# Patient Record
Sex: Female | Born: 1935 | Race: White | Hispanic: No | Smoking: Never smoker
Health system: Southern US, Community
[De-identification: ages and names within clinical notes are randomized; demographics above are authoritative.]

## PROBLEM LIST (undated history)

## (undated) DIAGNOSIS — I259 Chronic ischemic heart disease, unspecified: Secondary | ICD-10-CM

## (undated) DIAGNOSIS — I10 Essential (primary) hypertension: Secondary | ICD-10-CM

## (undated) DIAGNOSIS — I5032 Chronic diastolic (congestive) heart failure: Secondary | ICD-10-CM

## (undated) DIAGNOSIS — F32A Depression, unspecified: Secondary | ICD-10-CM

## (undated) DIAGNOSIS — I7100 Dissection of unspecified site of aorta: Secondary | ICD-10-CM

## (undated) DIAGNOSIS — E785 Hyperlipidemia, unspecified: Secondary | ICD-10-CM

## (undated) DIAGNOSIS — I4892 Unspecified atrial flutter: Secondary | ICD-10-CM

## (undated) DIAGNOSIS — K219 Gastro-esophageal reflux disease without esophagitis: Secondary | ICD-10-CM

## (undated) DIAGNOSIS — Z8673 Personal history of transient ischemic attack (TIA), and cerebral infarction without residual deficits: Secondary | ICD-10-CM

## (undated) DIAGNOSIS — F329 Major depressive disorder, single episode, unspecified: Secondary | ICD-10-CM

## (undated) DIAGNOSIS — I2510 Atherosclerotic heart disease of native coronary artery without angina pectoris: Secondary | ICD-10-CM

## (undated) DIAGNOSIS — I219 Acute myocardial infarction, unspecified: Secondary | ICD-10-CM

## (undated) DIAGNOSIS — I639 Cerebral infarction, unspecified: Secondary | ICD-10-CM

## (undated) DIAGNOSIS — Z950 Presence of cardiac pacemaker: Secondary | ICD-10-CM

## (undated) HISTORY — PX: JOINT REPLACEMENT: SHX530

## (undated) HISTORY — PX: APPENDECTOMY: SHX54

## (undated) HISTORY — DX: Chronic ischemic heart disease, unspecified: I25.9

## (undated) HISTORY — PX: OTHER SURGICAL HISTORY: SHX169

## (undated) HISTORY — DX: Essential (primary) hypertension: I10

## (undated) HISTORY — DX: Unspecified atrial flutter: I48.92

## (undated) HISTORY — DX: Gastro-esophageal reflux disease without esophagitis: K21.9

## (undated) HISTORY — DX: Dissection of unspecified site of aorta: I71.00

## (undated) HISTORY — DX: Depression, unspecified: F32.A

## (undated) HISTORY — DX: Atherosclerotic heart disease of native coronary artery without angina pectoris: I25.10

## (undated) HISTORY — DX: Hyperlipidemia, unspecified: E78.5

## (undated) HISTORY — DX: Personal history of transient ischemic attack (TIA), and cerebral infarction without residual deficits: Z86.73

## (undated) HISTORY — PX: ABDOMINAL HYSTERECTOMY: SHX81

## (undated) HISTORY — DX: Major depressive disorder, single episode, unspecified: F32.9

## (undated) HISTORY — DX: Acute myocardial infarction, unspecified: I21.9

## (undated) HISTORY — DX: Cerebral infarction, unspecified: I63.9

---

## 1999-09-16 ENCOUNTER — Encounter: Payer: Self-pay | Admitting: Emergency Medicine

## 1999-09-16 ENCOUNTER — Observation Stay (HOSPITAL_COMMUNITY): Admission: EM | Admit: 1999-09-16 | Discharge: 1999-09-17 | Payer: Self-pay | Admitting: Emergency Medicine

## 1999-09-16 ENCOUNTER — Encounter: Payer: Self-pay | Admitting: Neurology

## 1999-09-17 ENCOUNTER — Encounter: Payer: Self-pay | Admitting: Neurology

## 2006-03-23 ENCOUNTER — Ambulatory Visit: Payer: Self-pay | Admitting: Cardiovascular Disease

## 2006-03-23 ENCOUNTER — Encounter: Payer: Self-pay | Admitting: Cardiology

## 2006-03-23 ENCOUNTER — Inpatient Hospital Stay (HOSPITAL_COMMUNITY): Admission: EM | Admit: 2006-03-23 | Discharge: 2006-03-27 | Payer: Self-pay | Admitting: Emergency Medicine

## 2006-03-31 ENCOUNTER — Ambulatory Visit: Payer: Self-pay | Admitting: Internal Medicine

## 2006-04-08 ENCOUNTER — Ambulatory Visit: Payer: Self-pay | Admitting: Cardiovascular Disease

## 2006-04-09 ENCOUNTER — Ambulatory Visit: Payer: Self-pay

## 2006-04-14 ENCOUNTER — Ambulatory Visit: Payer: Self-pay | Admitting: Internal Medicine

## 2006-04-29 ENCOUNTER — Observation Stay (HOSPITAL_COMMUNITY): Admission: RE | Admit: 2006-04-29 | Discharge: 2006-05-01 | Payer: Self-pay | Admitting: Internal Medicine

## 2006-04-29 ENCOUNTER — Ambulatory Visit: Payer: Self-pay | Admitting: Internal Medicine

## 2006-05-04 ENCOUNTER — Ambulatory Visit: Payer: Self-pay | Admitting: Cardiology

## 2006-05-14 ENCOUNTER — Ambulatory Visit: Payer: Self-pay | Admitting: Cardiology

## 2006-05-19 ENCOUNTER — Ambulatory Visit: Payer: Self-pay | Admitting: Internal Medicine

## 2006-09-06 ENCOUNTER — Ambulatory Visit: Payer: Self-pay | Admitting: Internal Medicine

## 2006-09-06 ENCOUNTER — Ambulatory Visit: Payer: Self-pay | Admitting: Cardiology

## 2006-09-17 ENCOUNTER — Ambulatory Visit: Payer: Self-pay | Admitting: Cardiology

## 2006-12-23 ENCOUNTER — Ambulatory Visit: Payer: Self-pay | Admitting: Internal Medicine

## 2006-12-25 ENCOUNTER — Inpatient Hospital Stay (HOSPITAL_COMMUNITY): Admission: EM | Admit: 2006-12-25 | Discharge: 2006-12-26 | Payer: Self-pay | Admitting: Emergency Medicine

## 2006-12-31 ENCOUNTER — Ambulatory Visit: Payer: Self-pay | Admitting: Internal Medicine

## 2006-12-31 ENCOUNTER — Inpatient Hospital Stay (HOSPITAL_COMMUNITY): Admission: EM | Admit: 2006-12-31 | Discharge: 2007-01-02 | Payer: Self-pay | Admitting: Emergency Medicine

## 2006-12-31 LAB — CONVERTED CEMR LAB
Basophils Absolute: 0 10*3/uL (ref 0.0–0.1)
HCT: 24.5 % — ABNORMAL LOW (ref 36.0–46.0)
Hemoglobin: 8.6 g/dL — ABNORMAL LOW (ref 12.0–15.0)
Lymphocytes Relative: 8.1 % — ABNORMAL LOW (ref 12.0–46.0)
Monocytes Absolute: 1 10*3/uL — ABNORMAL HIGH (ref 0.2–0.7)
Monocytes Relative: 8.4 % (ref 3.0–11.0)
Neutrophils Relative %: 83.4 % — ABNORMAL HIGH (ref 43.0–77.0)

## 2007-05-03 ENCOUNTER — Ambulatory Visit: Payer: Self-pay | Admitting: *Deleted

## 2007-05-03 ENCOUNTER — Inpatient Hospital Stay (HOSPITAL_COMMUNITY): Admission: EM | Admit: 2007-05-03 | Discharge: 2007-05-06 | Payer: Self-pay | Admitting: Cardiology

## 2007-05-12 ENCOUNTER — Inpatient Hospital Stay (HOSPITAL_COMMUNITY): Admission: EM | Admit: 2007-05-12 | Discharge: 2007-05-18 | Payer: Self-pay | Admitting: Emergency Medicine

## 2007-05-12 ENCOUNTER — Encounter (INDEPENDENT_AMBULATORY_CARE_PROVIDER_SITE_OTHER): Payer: Self-pay | Admitting: Neurology

## 2007-05-20 ENCOUNTER — Ambulatory Visit: Payer: Self-pay | Admitting: Internal Medicine

## 2007-05-25 ENCOUNTER — Ambulatory Visit: Payer: Self-pay | Admitting: Internal Medicine

## 2007-05-31 ENCOUNTER — Ambulatory Visit: Payer: Self-pay | Admitting: Cardiology

## 2007-05-31 ENCOUNTER — Ambulatory Visit: Payer: Self-pay | Admitting: Cardiovascular Disease

## 2007-06-07 ENCOUNTER — Ambulatory Visit: Payer: Self-pay | Admitting: Cardiology

## 2007-06-21 ENCOUNTER — Ambulatory Visit: Payer: Self-pay | Admitting: Cardiology

## 2007-07-12 ENCOUNTER — Ambulatory Visit: Payer: Self-pay | Admitting: Cardiovascular Disease

## 2007-07-26 ENCOUNTER — Ambulatory Visit: Payer: Self-pay | Admitting: Cardiology

## 2007-08-09 ENCOUNTER — Ambulatory Visit: Payer: Self-pay | Admitting: Cardiology

## 2007-09-05 ENCOUNTER — Ambulatory Visit: Payer: Self-pay | Admitting: Internal Medicine

## 2007-09-09 ENCOUNTER — Inpatient Hospital Stay (HOSPITAL_COMMUNITY): Admission: EM | Admit: 2007-09-09 | Discharge: 2007-09-15 | Payer: Self-pay | Admitting: Emergency Medicine

## 2007-09-09 ENCOUNTER — Ambulatory Visit: Payer: Self-pay | Admitting: Cardiology

## 2007-09-12 ENCOUNTER — Ambulatory Visit: Payer: Self-pay | Admitting: Vascular Surgery

## 2007-09-12 ENCOUNTER — Encounter: Payer: Self-pay | Admitting: Vascular Surgery

## 2007-09-19 ENCOUNTER — Ambulatory Visit: Payer: Self-pay | Admitting: Internal Medicine

## 2007-09-23 ENCOUNTER — Ambulatory Visit: Payer: Self-pay

## 2007-09-23 LAB — CONVERTED CEMR LAB: Prothrombin Time: 15.4 s — ABNORMAL HIGH (ref 10.9–13.3)

## 2007-09-27 ENCOUNTER — Ambulatory Visit: Payer: Self-pay

## 2007-10-03 ENCOUNTER — Ambulatory Visit: Payer: Self-pay | Admitting: Cardiology

## 2007-10-13 ENCOUNTER — Ambulatory Visit: Payer: Self-pay | Admitting: Cardiology

## 2007-10-17 ENCOUNTER — Ambulatory Visit: Payer: Self-pay | Admitting: Internal Medicine

## 2007-10-27 ENCOUNTER — Ambulatory Visit: Payer: Self-pay | Admitting: Cardiology

## 2007-10-27 ENCOUNTER — Ambulatory Visit: Payer: Self-pay | Admitting: Internal Medicine

## 2007-10-27 LAB — CONVERTED CEMR LAB
BUN: 44 mg/dL — ABNORMAL HIGH (ref 6–23)
CO2: 29 meq/L (ref 19–32)
Calcium: 9.7 mg/dL (ref 8.4–10.5)
Creatinine, Ser: 1.9 mg/dL — ABNORMAL HIGH (ref 0.4–1.2)
GFR calc Af Amer: 33 mL/min
Potassium: 4.9 meq/L (ref 3.5–5.1)
Sodium: 143 meq/L (ref 135–145)

## 2007-11-14 ENCOUNTER — Ambulatory Visit: Payer: Self-pay | Admitting: Cardiology

## 2007-11-30 ENCOUNTER — Ambulatory Visit (HOSPITAL_COMMUNITY): Admission: RE | Admit: 2007-11-30 | Discharge: 2007-11-30 | Payer: Self-pay | Admitting: Internal Medicine

## 2007-12-16 ENCOUNTER — Ambulatory Visit: Payer: Self-pay | Admitting: Internal Medicine

## 2007-12-16 ENCOUNTER — Ambulatory Visit: Payer: Self-pay | Admitting: Cardiology

## 2008-01-13 ENCOUNTER — Ambulatory Visit: Payer: Self-pay | Admitting: Internal Medicine

## 2008-03-29 ENCOUNTER — Ambulatory Visit: Payer: Self-pay | Admitting: Internal Medicine

## 2008-04-18 ENCOUNTER — Encounter: Admission: RE | Admit: 2008-04-18 | Discharge: 2008-04-18 | Payer: Self-pay | Admitting: Vascular Surgery

## 2008-04-18 ENCOUNTER — Ambulatory Visit: Payer: Self-pay | Admitting: Vascular Surgery

## 2008-09-26 ENCOUNTER — Ambulatory Visit: Payer: Self-pay | Admitting: Internal Medicine

## 2008-10-17 ENCOUNTER — Encounter: Admission: RE | Admit: 2008-10-17 | Discharge: 2008-10-17 | Payer: Self-pay | Admitting: Vascular Surgery

## 2008-10-17 ENCOUNTER — Ambulatory Visit: Payer: Self-pay | Admitting: Vascular Surgery

## 2008-12-06 ENCOUNTER — Ambulatory Visit: Payer: Self-pay | Admitting: Internal Medicine

## 2008-12-06 ENCOUNTER — Encounter: Payer: Self-pay | Admitting: Internal Medicine

## 2009-01-18 ENCOUNTER — Telehealth: Payer: Self-pay | Admitting: Internal Medicine

## 2009-06-04 ENCOUNTER — Encounter: Payer: Self-pay | Admitting: Internal Medicine

## 2009-07-10 ENCOUNTER — Telehealth (INDEPENDENT_AMBULATORY_CARE_PROVIDER_SITE_OTHER): Payer: Self-pay | Admitting: *Deleted

## 2009-07-17 ENCOUNTER — Ambulatory Visit: Payer: Self-pay | Admitting: Vascular Surgery

## 2009-07-25 ENCOUNTER — Ambulatory Visit: Payer: Self-pay | Admitting: Internal Medicine

## 2009-07-29 DIAGNOSIS — I10 Essential (primary) hypertension: Secondary | ICD-10-CM

## 2009-07-29 DIAGNOSIS — I713 Abdominal aortic aneurysm, ruptured, unspecified: Secondary | ICD-10-CM | POA: Insufficient documentation

## 2009-07-29 DIAGNOSIS — I4891 Unspecified atrial fibrillation: Secondary | ICD-10-CM

## 2009-08-07 ENCOUNTER — Ambulatory Visit (HOSPITAL_COMMUNITY): Admission: RE | Admit: 2009-08-07 | Discharge: 2009-08-07 | Payer: Self-pay | Admitting: Gastroenterology

## 2009-09-27 ENCOUNTER — Encounter: Payer: Self-pay | Admitting: Internal Medicine

## 2010-01-01 ENCOUNTER — Encounter: Payer: Self-pay | Admitting: Internal Medicine

## 2010-01-27 ENCOUNTER — Ambulatory Visit: Payer: Self-pay | Admitting: Internal Medicine

## 2010-01-27 DIAGNOSIS — R634 Abnormal weight loss: Secondary | ICD-10-CM

## 2010-01-28 ENCOUNTER — Encounter: Payer: Self-pay | Admitting: Internal Medicine

## 2010-07-10 ENCOUNTER — Encounter: Admission: RE | Admit: 2010-07-10 | Discharge: 2010-07-10 | Payer: Self-pay | Admitting: Vascular Surgery

## 2010-07-10 ENCOUNTER — Ambulatory Visit: Payer: Self-pay | Admitting: Vascular Surgery

## 2010-07-10 ENCOUNTER — Encounter: Payer: Self-pay | Admitting: Internal Medicine

## 2010-08-04 ENCOUNTER — Ambulatory Visit: Payer: Self-pay | Admitting: Internal Medicine

## 2010-10-12 ENCOUNTER — Encounter: Payer: Self-pay | Admitting: Gastroenterology

## 2010-10-12 ENCOUNTER — Encounter: Payer: Self-pay | Admitting: Internal Medicine

## 2010-10-19 ENCOUNTER — Encounter: Payer: Self-pay | Admitting: Internal Medicine

## 2010-10-20 ENCOUNTER — Encounter: Payer: Self-pay | Admitting: Internal Medicine

## 2010-10-21 ENCOUNTER — Encounter: Payer: Self-pay | Admitting: Internal Medicine

## 2010-10-21 NOTE — Letter (Signed)
Summary: ECHO 4.14.11  ECHO 4.14.11   Imported By: Faythe Ghee 01/28/2010 09:50:03  _____________________________________________________________________  External Attachment:    Type:   Image     Comment:   External Document

## 2010-10-21 NOTE — Assessment & Plan Note (Signed)
Summary: PER CHECK OUT/SF   Primary Provider:  Damian Leavell  CC:  irregular heart beat/swelling in both ankles.  History of Present Illness: Ms. [name] returns today for followup.  She is a pleasant 77 yo woman with a h/o atrial fibrillation, s/p stroke complicated by vision loss.  She has a chronic aortic dissection with a residual aneurysm.  She is now in persisent atrial fibrillation but has adequate rate control.  She has diastolic CHF.  No other complaints today except that she continues lose weight as she has had a reduction in her oral intake. No c/p or sob.  Problems Prior to Update: 1)  Essential Hypertension, Benign  (ICD-401.1) 2)  Abdominal Aneurysm, Ruptured  (ICD-441.3) 3)  Atrial Fibrillation  (ICD-427.31)  Current Medications (verified): 1)  Aspirin 81 Mg Tabs (Aspirin) .... One By Mouth Daily 2)  Nexium 40 Mg Cpdr (Esomeprazole Magnesium) .... One By Mouth Daily 3)  Lisinopril 10 Mg Tabs (Lisinopril) .... One By Mouth Daily 4)  Metoprolol Tartrate 25 Mg Tabs (Metoprolol Tartrate) .... One By Mouth Daily 5)  Otc Allergy Med 10 Mg .... One By Mouth Daily As Needed 6)  Xalatan 0.005 % Soln (Latanoprost) .... At Bedtime 7)  Simvastatin 10 Mg Tabs (Simvastatin) .... One By Mouth At Bedtime 8)  Fluoxetine Hcl 40 Mg Caps (Fluoxetine Hcl) .... One By Mouth Daily 9)  Furosemide 20 Mg Tabs (Furosemide) .... One By Mouth Daily 10)  Digoxin 0.125 Mg Tabs (Digoxin) .... 1/2 By Mouth Daily 11)  Meclizine Hcl 25 Mg Tabs (Meclizine Hcl) .... Take 1 Tablet By Mouth Twice A Day Prn 12)  Promethazine Hcl 25 Mg Tabs (Promethazine Hcl) .... As Needed 13)  Nitrostat 0.4 Mg Subl (Nitroglycerin) .Marland Kitchen.. 1 Tablet Under Tongue At Onset of Chest Pain; You May Repeat Every 5 Minutes For Up To 3 Doses. 14)  Aricept 5 Mg Tabs (Donepezil Hcl) .... Once Daily 15)  Sertraline Hcl 50 Mg Tabs (Sertraline Hcl) .... Once Daily  Allergies: 1)  ! Amoxicillin 2)  ! * Latex  Past History:  Past  Medical History: Last updated: 12/06/2008 Recurrent (asymptomatic) atrial flutter off of amiodarone.  Coronary artery disease.  History of stroke Contained aortic dissection Hypertension Ischemic heart disease status post myocardial infarction GERD  depression  Review of Systems       The patient complains of weight loss.  The patient denies chest pain, syncope, and peripheral edema.    Vital Signs:  Patient profile:   76 year old female Height:      65 inches Weight:      134 pounds BMI:     22.38 Pulse rate:   50 / minute BP sitting:   141 / 79  (left arm) Cuff size:   regular  Vitals Entered By: Oswald Hillock (Jan 27, 2010 12:02 PM)  Physical Exam  General:  77 yo woman NAD. Head:  normocephalic and atraumatic Eyes:  PERRLA/EOM intact; conjunctiva and lids normal. Neck:  Neck supple, no JVD. No masses, thyromegaly or abnormal cervical nodes. Lungs:  Clear bilaterally with no wheezes, rales, or rhonchi. Heart:  RRR with normal S1 and S2.  PMI is not enlarged. Abdomen:  Bowel sounds positive; abdomen soft and minimally tender without masses, organomegaly, or hernias noted. No hepatosplenomegaly. Msk:  Back normal, normal gait. Muscle strength and tone normal. Pulses:  1+ bilaterally. Extremities:  No clubbing or cyanosis. Neurologic:  Alert and oriented x 3. some generalized weakness.   Impression &  Recommendations:  Problem # 1:  ATRIAL FIBRILLATION (ICD-427.31) Her exam today demonstrates a return to NSR.  She will continue her current meds. The following medications were removed from the medication list:    Warfarin Sodium 5 Mg Tabs (Warfarin sodium) ..... Use as directed by anticoagulation clinic Her updated medication list for this problem includes:    Aspirin 81 Mg Tabs (Aspirin) ..... One by mouth daily    Metoprolol Tartrate 25 Mg Tabs (Metoprolol tartrate) ..... One by mouth daily    Digoxin 0.125 Mg Tabs (Digoxin) .Marland Kitchen... 1/2 by mouth daily  Problem  # 2:  ESSENTIAL HYPERTENSION, BENIGN (ICD-401.1) I have asked her to maintain a low sodium diet.  She will continue her meds as below. Her updated medication list for this problem includes:    Aspirin 81 Mg Tabs (Aspirin) ..... One by mouth daily    Lisinopril 10 Mg Tabs (Lisinopril) ..... One by mouth daily    Metoprolol Tartrate 25 Mg Tabs (Metoprolol tartrate) ..... One by mouth daily    Furosemide 20 Mg Tabs (Furosemide) ..... One by mouth daily  Problem # 3:  WEIGHT LOSS (ICD-783.21) I have asked her to increase her by mouth intake.   Patient Instructions: 1)  Your physician wants you to follow-up in: 6 months with Dr Ladona Ridgel.  You will receive a reminder letter in the mail two months in advance. If you don't receive a letter, please call our office to schedule the follow-up appointment.  Appended Document: PER CHECK OUT/SF Note discontinuation of her coumadin secondary to falls. I agree.

## 2010-10-21 NOTE — Letter (Signed)
Summary: MED LIST 1.5.2011  MED LIST 1.5.2011   Imported By: Faythe Ghee 01/28/2010 09:49:39  _____________________________________________________________________  External Attachment:    Type:   Image     Comment:   External Document

## 2010-10-21 NOTE — Assessment & Plan Note (Signed)
Summary: 6 month rov/sl   Visit Type:  Follow-up Primary Provider:  Dr.Robert Reinaldo Raddle   History of Present Illness: Ms. [name] returns today for followup.  She is a pleasant 77 yo woman with a h/o atrial fibrillation, s/p stroke complicated by vision loss.  She has a chronic aortic dissection with a residual aneurysm.  She is now in persisent atrial fibrillation but has adequate rate control.  She has diastolic CHF.  Since I last saw her she has gained weight.  No c/p or sob. She has been hospitalized for atrial fib with an RVR.  Previously she developed thyroid problems with amiodarone.  Current Medications (verified): 1)  Aspirin 81 Mg Tabs (Aspirin) .... One By Mouth Daily 2)  Nexium 40 Mg Cpdr (Esomeprazole Magnesium) .... One By Mouth Daily 3)  Lisinopril 10 Mg Tabs (Lisinopril) .... One By Mouth Daily 4)  Metoprolol Tartrate 25 Mg Tabs (Metoprolol Tartrate) .... 1/2 Two Times A Day 5)  Otc Allergy Med 10 Mg .... One By Mouth Daily As Needed 6)  Xalatan 0.005 % Soln (Latanoprost) .... At Bedtime 7)  Simvastatin 10 Mg Tabs (Simvastatin) .... One By Mouth At Bedtime 8)  Fluoxetine Hcl 40 Mg Caps (Fluoxetine Hcl) .... One By Mouth Daily 9)  Furosemide 20 Mg Tabs (Furosemide) .... One By Mouth Daily 10)  Meclizine Hcl 25 Mg Tabs (Meclizine Hcl) .... Take 1 Tablet By Mouth Twice A Day Prn 11)  Promethazine Hcl 25 Mg Tabs (Promethazine Hcl) .... As Needed 12)  Nitrostat 0.4 Mg Subl (Nitroglycerin) .Marland Kitchen.. 1 Tablet Under Tongue At Onset of Chest Pain; You May Repeat Every 5 Minutes For Up To 3 Doses. 13)  Aricept 5 Mg Tabs (Donepezil Hcl) .... Once Daily 14)  Sertraline Hcl 50 Mg Tabs (Sertraline Hcl) .... Once Daily  Allergies: 1)  ! Amoxicillin 2)  ! * Latex  Past History:  Past Medical History: Last updated: 12/06/2008 Recurrent (asymptomatic) atrial flutter off of amiodarone.  Coronary artery disease.  History of stroke Contained aortic dissection Hypertension Ischemic heart  disease status post myocardial infarction GERD  depression  Review of Systems  The patient denies chest pain, syncope, dyspnea on exertion, and peripheral edema.    Vital Signs:  Patient profile:   77 year old female Height:      65 inches Weight:      143 pounds BMI:     23.88 Pulse rate:   54 / minute BP sitting:   92 / 70  (left arm)  Vitals Entered By: Laurance Flatten CMA (August 04, 2010 10:16 AM)  Physical Exam  General:  Well developed, well nourished, in no acute distress. Head:  normocephalic and atraumatic Eyes:  PERRLA/EOM intact; conjunctiva and lids normal. Neck:  Neck supple, no JVD. No masses, thyromegaly or abnormal cervical nodes. Lungs:  Clear bilaterally with no wheezes, rales, or rhonchi. Heart:  RRR with normal S1 and S2.  PMI is not enlarged. Abdomen:  Bowel sounds positive; abdomen soft and minimally tender without masses, organomegaly, or hernias noted. No hepatosplenomegaly. Pulses:  1+ bilaterally. Extremities:  No clubbing or cyanosis. Neurologic:  Alert and oriented x 3. some generalized weakness.   EKG  Procedure date:  08/04/2010  Findings:      Normal sinus rhythm with rate of:  54.  Impression & Recommendations:  Problem # 1:  ATRIAL FIBRILLATION (ICD-427.31) Her symptoms are still not well controlled.  I have discussed the treatment options and have recommended she try Multaq in hopes  of better rhythm control.  Will follow. The following medications were removed from the medication list:    Digoxin 0.125 Mg Tabs (Digoxin) .Marland Kitchen... 1/2 by mouth daily Her updated medication list for this problem includes:    Aspirin 81 Mg Tabs (Aspirin) ..... One by mouth daily    Metoprolol Tartrate 25 Mg Tabs (Metoprolol tartrate) .Marland Kitchen... 1/2 two times a day    Multaq 400 Mg Tabs (Dronedarone hcl) ..... One by mouth bid  Problem # 2:  ESSENTIAL HYPERTENSION, BENIGN (ICD-401.1) Her blood pressure is well controlled.  She may not require her lisinopril but  she is not symptomatic. Her updated medication list for this problem includes:    Aspirin 81 Mg Tabs (Aspirin) ..... One by mouth daily    Lisinopril 10 Mg Tabs (Lisinopril) ..... One by mouth daily    Metoprolol Tartrate 25 Mg Tabs (Metoprolol tartrate) .Marland Kitchen... 1/2 two times a day    Furosemide 20 Mg Tabs (Furosemide) ..... One by mouth daily  Problem # 3:  WEIGHT LOSS (ICD-783.21) This has resolved. Her appetite is improved.  Patient Instructions: 1)  Your physician recommends that you schedule a follow-up appointment in: 3 months with Dr Ladona Ridgel 2)  Your physician has recommended you make the following change in your medication: start Multaq 400mg  bid Prescriptions: MULTAQ 400 MG TABS (DRONEDARONE HCL) one by mouth bid  #60 x 6   Entered by:   Dennis Bast, RN, BSN   Authorized by:   Laren Boom, MD, [facility]   Signed by:   Dennis Bast, RN, BSN on 08/04/2010   Method used:   Print then Give to Patient   RxID:   [id number]

## 2010-10-21 NOTE — Letter (Signed)
Summary: EKG 8.26.2009  EKG 8.26.2009   Imported By: Faythe Ghee 01/28/2010 09:49:05  _____________________________________________________________________  External Attachment:    Type:   Image     Comment:   External Document

## 2010-10-21 NOTE — Letter (Signed)
Summary: [facility]   Imported By: Marylou Mccoy 03/25/2010 13:02:43  _____________________________________________________________________  External Attachment:    Type:   Image     Comment:   External Document

## 2010-10-21 NOTE — Letter (Signed)
Summary: [facility] Visit   [facility] Visit   Imported By: Roderic Ovens 08/07/2010 11:20:27  _____________________________________________________________________  External Attachment:    Type:   Image     Comment:   External Document

## 2010-10-21 NOTE — Procedures (Signed)
Summary: Holter ECG Report  Holter ECG Report   Imported By: Marylou Mccoy 03/25/2010 13:03:25  _____________________________________________________________________  External Attachment:    Type:   Image     Comment:   External Document

## 2010-10-22 ENCOUNTER — Telehealth: Payer: Self-pay | Admitting: Internal Medicine

## 2010-10-24 ENCOUNTER — Encounter: Payer: Self-pay | Admitting: Internal Medicine

## 2010-10-29 NOTE — Progress Notes (Signed)
Summary: pt in hospital  Phone Note Call from Patient Call back at [phone]   Caller: sister/Donna Fargus Reason for Call: Talk to Nurse, Talk to Doctor Summary of Call: pt has benn in [facility] since Sunday night due to chest pain. The family has been told pt may have to be moved to [facility] so pt can have another cath. Initial call taken by: Omer Jack,  October 22, 2010 8:47 AM  Follow-up for Phone Call        spoke with pt's sister  will have the Dr in [facility] call Dr Russ Halo, RN, BSN  October 22, 2010 10:41 AM

## 2010-11-05 ENCOUNTER — Ambulatory Visit: Payer: Self-pay | Admitting: Internal Medicine

## 2011-01-07 ENCOUNTER — Encounter: Payer: Self-pay | Admitting: *Deleted

## 2011-01-07 ENCOUNTER — Encounter: Payer: Self-pay | Admitting: Internal Medicine

## 2011-01-08 ENCOUNTER — Ambulatory Visit: Payer: Self-pay | Admitting: Internal Medicine

## 2011-01-08 ENCOUNTER — Telehealth: Payer: Self-pay | Admitting: Internal Medicine

## 2011-01-08 NOTE — Telephone Encounter (Signed)
Went to hospital last night for afib and chest pain She is still in hospital She was due to see Dr Ladona Ridgel today and will keep [place] informed

## 2011-01-15 ENCOUNTER — Inpatient Hospital Stay (HOSPITAL_COMMUNITY)
Admission: AD | Admit: 2011-01-15 | Discharge: 2011-01-20 | DRG: 242 | Disposition: A | Discharge status: Skilled Nursing Facility | Payer: Medicare FFS | Source: Other Acute Inpatient Hospital | Attending: Internal Medicine | Admitting: Internal Medicine

## 2011-01-15 DIAGNOSIS — I2510 Atherosclerotic heart disease of native coronary artery without angina pectoris: Secondary | ICD-10-CM | POA: Diagnosis present

## 2011-01-15 DIAGNOSIS — I10 Essential (primary) hypertension: Secondary | ICD-10-CM | POA: Diagnosis present

## 2011-01-15 DIAGNOSIS — Z7901 Long term (current) use of anticoagulants: Secondary | ICD-10-CM

## 2011-01-15 DIAGNOSIS — I4891 Unspecified atrial fibrillation: Secondary | ICD-10-CM | POA: Diagnosis present

## 2011-01-15 DIAGNOSIS — R55 Syncope and collapse: Secondary | ICD-10-CM

## 2011-01-15 DIAGNOSIS — Z8673 Personal history of transient ischemic attack (TIA), and cerebral infarction without residual deficits: Secondary | ICD-10-CM

## 2011-01-15 DIAGNOSIS — Z7982 Long term (current) use of aspirin: Secondary | ICD-10-CM

## 2011-01-15 DIAGNOSIS — F329 Major depressive disorder, single episode, unspecified: Secondary | ICD-10-CM | POA: Diagnosis present

## 2011-01-15 DIAGNOSIS — I4892 Unspecified atrial flutter: Secondary | ICD-10-CM | POA: Diagnosis present

## 2011-01-15 DIAGNOSIS — I959 Hypotension, unspecified: Secondary | ICD-10-CM

## 2011-01-15 DIAGNOSIS — Z9861 Coronary angioplasty status: Secondary | ICD-10-CM

## 2011-01-15 DIAGNOSIS — Z880 Allergy status to penicillin: Secondary | ICD-10-CM

## 2011-01-15 DIAGNOSIS — Z79899 Other long term (current) drug therapy: Secondary | ICD-10-CM

## 2011-01-15 DIAGNOSIS — I7779 Dissection of other artery: Secondary | ICD-10-CM | POA: Diagnosis present

## 2011-01-15 DIAGNOSIS — K219 Gastro-esophageal reflux disease without esophagitis: Secondary | ICD-10-CM | POA: Diagnosis present

## 2011-01-15 DIAGNOSIS — I495 Sick sinus syndrome: Principal | ICD-10-CM | POA: Diagnosis present

## 2011-01-15 DIAGNOSIS — I252 Old myocardial infarction: Secondary | ICD-10-CM

## 2011-01-15 DIAGNOSIS — Z91040 Latex allergy status: Secondary | ICD-10-CM

## 2011-01-15 DIAGNOSIS — I509 Heart failure, unspecified: Secondary | ICD-10-CM | POA: Diagnosis present

## 2011-01-15 DIAGNOSIS — I714 Abdominal aortic aneurysm, without rupture, unspecified: Secondary | ICD-10-CM | POA: Diagnosis present

## 2011-01-15 DIAGNOSIS — I951 Orthostatic hypotension: Secondary | ICD-10-CM | POA: Diagnosis present

## 2011-01-15 DIAGNOSIS — I5032 Chronic diastolic (congestive) heart failure: Secondary | ICD-10-CM | POA: Diagnosis present

## 2011-01-15 DIAGNOSIS — F3289 Other specified depressive episodes: Secondary | ICD-10-CM | POA: Diagnosis present

## 2011-01-15 LAB — DIFFERENTIAL
Eosinophils Absolute: 0.1 10*3/uL (ref 0.0–0.7)
Lymphs Abs: 2.5 10*3/uL (ref 0.7–4.0)
Monocytes Relative: 9 % (ref 3–12)
Neutrophils Relative %: 60 % (ref 43–77)

## 2011-01-15 LAB — CBC
Hemoglobin: 12.3 g/dL (ref 12.0–15.0)
MCH: 29.1 pg (ref 26.0–34.0)
MCV: 88.4 fL (ref 78.0–100.0)
Platelets: 228 10*3/uL (ref 150–400)
RBC: 4.23 MIL/uL (ref 3.87–5.11)

## 2011-01-15 LAB — COMPREHENSIVE METABOLIC PANEL
BUN: 24 mg/dL — ABNORMAL HIGH (ref 6–23)
CO2: 23 mEq/L (ref 19–32)
Chloride: 115 mEq/L — ABNORMAL HIGH (ref 96–112)
Creatinine, Ser: 1.28 mg/dL — ABNORMAL HIGH (ref 0.4–1.2)
GFR calc non Af Amer: 41 mL/min — ABNORMAL LOW (ref >60)
Glucose, Bld: 150 mg/dL — ABNORMAL HIGH (ref 70–99)
Total Bilirubin: 0.3 mg/dL (ref 0.3–1.2)

## 2011-01-16 ENCOUNTER — Inpatient Hospital Stay (HOSPITAL_COMMUNITY): Payer: Medicare FFS

## 2011-01-16 DIAGNOSIS — I495 Sick sinus syndrome: Secondary | ICD-10-CM

## 2011-01-16 LAB — CBC
HCT: 34.7 % — ABNORMAL LOW (ref 36.0–46.0)
Hemoglobin: 11.2 g/dL — ABNORMAL LOW (ref 12.0–15.0)
MCHC: 32.3 g/dL (ref 30.0–36.0)
RBC: 3.93 MIL/uL (ref 3.87–5.11)

## 2011-01-16 LAB — TSH: TSH: 5.782 u[IU]/mL — ABNORMAL HIGH (ref 0.350–4.500)

## 2011-01-16 LAB — BASIC METABOLIC PANEL
Calcium: 8.4 mg/dL (ref 8.4–10.5)
GFR calc Af Amer: 59 mL/min — ABNORMAL LOW (ref >60)
GFR calc non Af Amer: 49 mL/min — ABNORMAL LOW (ref >60)
Potassium: 3.9 mEq/L (ref 3.5–5.1)
Sodium: 141 mEq/L (ref 135–145)

## 2011-01-16 LAB — PROTIME-INR
Prothrombin Time: 17.4 seconds — ABNORMAL HIGH (ref 11.6–15.2)
Prothrombin Time: 18.5 seconds — ABNORMAL HIGH (ref 11.6–15.2)

## 2011-01-16 LAB — LIPID PANEL
Cholesterol: 94 mg/dL (ref 0–200)
LDL Cholesterol: 45 mg/dL (ref 0–99)
Total CHOL/HDL Ratio: 2.2 RATIO
VLDL: 6 mg/dL (ref 0–40)

## 2011-01-16 LAB — HEPARIN LEVEL (UNFRACTIONATED): Heparin Unfractionated: 0.37 IU/mL (ref 0.30–0.70)

## 2011-01-17 ENCOUNTER — Inpatient Hospital Stay (HOSPITAL_COMMUNITY): Payer: Medicare FFS

## 2011-01-17 DIAGNOSIS — I359 Nonrheumatic aortic valve disorder, unspecified: Secondary | ICD-10-CM

## 2011-01-17 LAB — CBC
MCH: 28.4 pg (ref 26.0–34.0)
MCHC: 32.7 g/dL (ref 30.0–36.0)
Platelets: 180 10*3/uL (ref 150–400)
RDW: 15.4 % (ref 11.5–15.5)

## 2011-01-17 LAB — HEPARIN LEVEL (UNFRACTIONATED)
Heparin Unfractionated: 0.14 IU/mL — ABNORMAL LOW (ref 0.30–0.70)
Heparin Unfractionated: 0.47 IU/mL (ref 0.30–0.70)

## 2011-01-17 LAB — PROTIME-INR
INR: 1.76 — ABNORMAL HIGH (ref 0.00–1.49)
Prothrombin Time: 20.7 seconds — ABNORMAL HIGH (ref 11.6–15.2)

## 2011-01-18 LAB — CBC
HCT: 32 % — ABNORMAL LOW (ref 36.0–46.0)
WBC: 6.2 10*3/uL (ref 4.0–10.5)

## 2011-01-19 LAB — PROTIME-INR
INR: 2.21 — ABNORMAL HIGH (ref 0.00–1.49)
Prothrombin Time: 24.7 seconds — ABNORMAL HIGH (ref 11.6–15.2)

## 2011-01-19 NOTE — H&P (Signed)
NAMEJIMMI, SIDENER               ACCOUNT NO.:  [id number]  MEDICAL RECORD NO.:  [id number]           PATIENT TYPE:  I  LOCATION:  2013                         FACILITY:  [facility]  PHYSICIAN:  Therisa Doyne, MD    DATE OF BIRTH:  02-02-1933  DATE OF ADMISSION:  01/15/2011 DATE OF DISCHARGE:                             HISTORY & PHYSICAL   CHIEF COMPLAINT:  Dizziness and presyncope with documented orthostatic hypotension and recurrent paroxysmal atrial fibrillation at the outside hospital.  HISTORY OF PRESENT ILLNESS:  A 77 year old white female with extensive cardiac history who presents for evaluation and management of recurrent atrial flutter and orthostatic hypotension.  Historically, she has a longstanding history of paroxysmal atrial flutter.  In 2007, she had an EP study which demonstrated atypical atrial flutter and was unable to be ablated.  She has been maintained in the past on Coumadin but this was discontinued for unclear reasons more recently.  She has been hospitalized 3 times in [place], [place] over the past month for recurrent atrial flutter.  She reports that for the past few weeks, she has had multiple episodes of weakness and dizziness and presyncope, she correlates this with times that her heart is out of rhythm.  On Saturday, she had a frank syncopal episode.  On January 08, 2011, she was hospitalized to [facility] and was given Cardizem and reverted back to normal sinus rhythm.  She then represented there on January 14, 2011, as well as January 15, 2011, for evaluation of presyncopal symptoms. There, she was documented to be orthostatic and at the patient's request, she was transferred to [facility] for further evaluation and management.  PAST MEDICAL HISTORY: 1. Paroxysmal atypical atrial flutter.  She has been on Coumadin for     the past 1 week. 2. History of CVA. 3. Chronic abdominal aortic dissection diagnosed in 2008, with     aneurysm. 4.  Diastolic heart failure. 5. Abnormal thyroid function testing on amiodarone. 6. Hypertension with gastroesophageal reflux disease. 7. Depression. 8. History of myocardial infarction. 9. Coronary artery disease status post PCI to the distal LAD with most     recent cath in 2008, demonstrating borderline LAD lesion. 10.Orthostatic hypotension.  SOCIAL HISTORY:  The patient lives at home by herself.  She does have an aide that comes in 5 hours per day and the aide is there 5 days per week.  She denies any tobacco, alcohol, or drug use.  FAMILY HISTORY:  Notable for a sister who has pacemaker.  ALLERGIES:  PENICILLIN.  MEDICATIONS:  The family is unclear of the exact medications that she is on, however, the medication list from __________ demonstrates the following: 1. Alphagan eye drops. 2. Coumadin 5 mg daily. 3. Diltiazem 180 mg daily. 4. Donepezil 5 mg daily. 5. Lasix 20 mg daily. 6. Lisinopril 5 mg daily. 7. Meclizine p.r.n. 8. Nexium 40 mg daily. 9. Pravastatin 20 mg daily. 10.Xalatan eye drops. 11.Zoloft 50 mg daily. 12.Zyrtec 10 mg daily.  REVIEW OF SYSTEMS:  All systems were reviewed and were negative except for what is mentioned above in history of present  illness.  PHYSICAL EXAMINATION:  VITAL SIGNS:  Temperature afebrile, blood pressure is 109/52 with a heart rate of 80, oxygen saturation is 98% on 2 liters nasal cannula.  Orthostatic vital signs in the outside hospital showed a blood pressure lying down of 126/59 and pulse of 105.  Vital signs standing up showed blood pressure 100/55 and a heart rate of 160. GENERAL:  No acute distress. HEENT:  Normocephalic, atraumatic.  Pupils were equal, round, and reactive to light and accommodation.  Oropharynx is pink and moist without any lesions. NECK:  Supple.  No lymphadenopathy.  No jugular venous distention.  No masses. CARDIOVASCULAR:  Irregularly irregular rhythm with a 2/6 systolic murmur heard at the  apex. CHEST:  Clear to auscultation bilaterally. ABDOMEN:  Positive bowel sounds, soft, nontender, and nondistended. EXTREMITIES:  No clubbing or cyanosis.  There is trace lower extremity edema.  Dorsalis pedis pulse 2+ bilaterally. SKIN:  No rashes. BACK:  No CVA tenderness. PSYCHIATRIC:  Normal affect.  LAB DATA:  From the outside hospital demonstrates a normal white blood cell count, hemoglobin 12.4, platelets 228.  BUN 30, creatinine 1.5 which appears to be somewhat stable.  Normal liver function studies. Potassium 4.3.  EKG from the outside hospital today at 12:24 demonstrates atypical atrial flutter at a rate of 109 beats per minute with a QTc of 440 by measurement.  IMPRESSION AND PLAN:  Ms. Lansdale is a 77 year old white female with past medical history significant for paroxysmal atypical atrial flutter and orthostatic hypotension, who presents with multiple episodes of presyncope and dizziness which related to combination of orthostatic hypotension and recurrent atrial flutter.  1. We will admit the patient to Dr. Lubertha Basque service under [facility]     Cardiology. 2. Recurrent paroxysmal atypical atrial flutter.  She is symptomatic     with this and because of that, I feel that she would benefit from     her rhythm control strategy.  Unfortunately, her flutter appears to     be atypical which may make ablation difficult.  We would recommend     considering a TEE and cardioversion with hopes to restoring normal     sinus rhythm, would also consider placing her on antiarrhythmic     drug such as Tikosyn.  Of note, she has been intolerant in the past     to [facility] which causes GI upset and in her medical history, there     is documentation of thyroid abnormalities in the setting of     amiodarone.  For the interim time this evening,  we will place her     on IV heparin drip and continue her oral diltiazem. 3. Orthostatic hypotension.  We will discontinue her lisinopril and      Lasix.  We will give her gentle volume resuscitation with normal     saline at 75 mL per hour.  Check an echocardiogram.  We will also     increase her SSRI to Zoloft 100 mg daily as this may help with some     of her symptoms. 4. Coronary artery disease.  She does not have any signs of ischemia.     We will start an aspirin 81 mg daily and continue her home statin.     Of note, her beta-blocker was recently discontinued. 5. Fluids, electrolytes, and nutrition.  Normal saline as above.     Check serum electrolytes, n.p.o. after midnight. 6. DVT prophylaxis not indicated.  The patient will be on heparin  drip.     Therisa Doyne, MD     SJT/MEDQ  D:  01/15/2011  T:  01/15/2011  Job:  [id number]  Electronically Signed by Aldona Bar MD on 01/19/2011 09:56:40 PM

## 2011-01-20 DIAGNOSIS — I4891 Unspecified atrial fibrillation: Secondary | ICD-10-CM

## 2011-01-20 LAB — CBC
MCHC: 33.2 g/dL (ref 30.0–36.0)
MCV: 87.4 fL (ref 78.0–100.0)
Platelets: 191 10*3/uL (ref 150–400)
RDW: 15.8 % — ABNORMAL HIGH (ref 11.5–15.5)
WBC: 5.5 10*3/uL (ref 4.0–10.5)

## 2011-01-20 LAB — PROTIME-INR: INR: 2.72 — ABNORMAL HIGH (ref 0.00–1.49)

## 2011-01-21 ENCOUNTER — Encounter: Payer: Medicare FFS | Admitting: *Deleted

## 2011-01-21 ENCOUNTER — Ambulatory Visit: Payer: Medicare FFS | Admitting: *Deleted

## 2011-01-22 ENCOUNTER — Ambulatory Visit: Payer: Self-pay | Admitting: Internal Medicine

## 2011-01-23 ENCOUNTER — Encounter: Payer: Medicare FFS | Admitting: *Deleted

## 2011-01-27 NOTE — Op Note (Signed)
[name], [name]               ACCOUNT NO.:  [id number]  MEDICAL RECORD NO.:  [id number]           PATIENT TYPE:  I  LOCATION:  2013                         FACILITY:  [facility]  PHYSICIAN:  Hillis Range, MD       DATE OF BIRTH:  1933-05-11  DATE OF PROCEDURE: DATE OF DISCHARGE:                              OPERATIVE REPORT   SURGEON:  Hillis Range, MD  PREPROCEDURE DIAGNOSES: 1. Tachycardia bradycardia syndrome. 2. Persistent atrial fibrillation and atypical atrial flutter. 3. Syncope.  POSTPROCEDURE DIAGNOSES: 1. Tachycardia bradycardia syndrome. 2. Persistent atrial fibrillation and atypical atrial flutter. 3. Syncope.  PROCEDURE:  Pacemaker implantation.  INTRODUCTION:  [name] is a pleasant 77 year old female with a history of longstanding persistent atrial fibrillation and atypical atrial flutter.  She was recently admitted following a syncopal episode. She has difficulties with rapid ventricular rates as well as intermittent episodes of bradycardia.  She reports symptoms of fatigue and dizziness.  The patient previously underwent EP study by Dr. Lewayne Bunting in 2007 at which time she was found to have multiple atypical atrial flutters which were felt to be not amenable to catheter ablation. She has failed amiodarone therapy.  She therefore presents today for pacemaker implantation for tachycardia bradycardia syndrome.  DESCRIPTION OF PROCEDURE:  Informed written consent was obtained and the patient was brought to the electrophysiology lab in the fasting state. She was adequately sedated with intravenous Versed as outlined in the nursing report.  The patient's left chest was prepped and draped in the usual sterile fashion by the EP lab staff.  The skin overlying the left deltopectoral region was infiltrated with lidocaine for local analgesia. A 4-cm incision was made over the left deltopectoral region.  A left subcutaneous pacemaker pocket was fashioned using a  combination of sharp and blunt dissection.  Electrocautery was required to assure hemostasis. The left cephalic vein was directly visualized and cannulated.  Through the left cephalic vein, a [employer] Tendril STS model 2088 TC - 46 (serial number [id number]) right atrial lead and a [employer] IsoFlex model 1948 - 58 (serial number [id number]) right ventricular lead were advanced with fluoroscopic visualization into the right atrial appendage and right ventricular apex positions respectively.  Initial atrial fibrillation waves measured 1.2 mV with impedance of 449 ohms. Right ventricular lead R-waves measured 21.7 mV with an impedance of 576 ohms and a threshold of 0.3 volts at 0.4 milliseconds.  Both leads were therefore secured to the pectoralis fascia using #2 silk suture over the suture sleeves.  The leads were then connected to a [employer] Accent DR RF, model [id number] (serial number [id number]) pacemaker.  The pacemaker pocket was then irrigated with copious gentamicin solution. The pacemaker was then placed into the pocket and closed in two layers with 2.0 Vicryl suture for the subcutaneous and subcuticular layers. Steri-Strips and a sterile dressing were then applied.  There were no early apparent complications.  No contrast was required for the procedure today.  CONCLUSIONS: 1. Successful pacemaker implantation for tachycardia bradycardia     syndrome. 2. No early apparent complications.  Hillis Range, MD     JA/MEDQ  D:  01/16/2011  T:  01/17/2011  Job:  [id number]  Electronically Signed by Hillis Range MD on 01/27/2011 08:15:47 PM

## 2011-01-27 NOTE — Consult Note (Signed)
[name], [name]               ACCOUNT NO.:  [id number]  MEDICAL RECORD NO.:  [id number]           PATIENT TYPE:  I  LOCATION:  2013                         FACILITY:  [facility]  PHYSICIAN:  Hillis Range, MD       DATE OF BIRTH:  03/26/1933  DATE OF CONSULTATION: DATE OF DISCHARGE:                                CONSULTATION   REQUESTING PHYSICIAN:  Colleen Can. Deborah Chalk, MD  REASON FOR CONSULTATION:  Tachycardia bradycardia syndrome.  HISTORY OF PRESENT ILLNESS:  [name] is a pleasant 77 year old female with longstanding atrial arrhythmias including atypical atrial flutter and atrial fibrillation.  She has failed multiple antiarrhythmic medications including amiodarone.  She continues to have intermittent episodes of atrial fibrillation as well as atypical atrial flutter.  She has had difficulties with tachycardia as well as bradycardia.  She has had elevation in her heart rates with symptoms of palpitations as well as symptoms of bradycardia with symptoms of dizziness and weakness.  She also has frequent orthostatic dizziness.  She feels that some episodes of dizziness, however, occur when resting.  Apparently several days ago, she had a frank episode of syncope and was hospitalized at [facility].  She required Cardizem for rapid ventricular rates and subsequently converted back to sinus rhythm.  After her fall, Coumadin was discontinued.  Unfortunately, she has had multiple strokes in the setting of stopping her Coumadin in the past.  Presently, she is resting comfortably and is without complaint.  She continues to have progressive decline and therefore was brought to [facility] for further management.  PAST MEDICAL HISTORY: 1. Persistent atypical atrial flutter and atrial fibrillation. 2. Status post prior stroke. 3. Chronic abdominal aortic dissection in 2008 with aneurysm. 4. Diastolic dysfunction. 5. Orthostatic hypotension. 6. Tachycardia bradycardia  syndrome. 7. Abnormal thyroid function on amiodarone. 8. Hypertension. 9. GERD. 10.Depression. 11.Coronary artery disease status post PCI of the distal LAD in 2008.  MEDICATIONS:  Reviewed in the [facility].  ALLERGIES:  PENICILLIN and LATEX.  SOCIAL HISTORY:  The patient lives alone.  She denies tobacco, alcohol, or drug use.  FAMILY HISTORY:  Her sister has a pacemaker.  She is unaware of any other significant family history.  REVIEW OF SYSTEMS:  All systems reviewed and negative except as outlined in the HPI above.  PHYSICAL EXAMINATION:  Telemetry reveals atypical atrial flutter as well as atrial fibrillation with multiple episodes of tachycardia with heart rates in the 120s as well as bradycardia with heart rates in the 40s. VITAL SIGNS:  Blood pressure 126/70, heart rate 80-90, respirations 18, sats 98% on room air, afebrile. GENERAL:  The patient is an elderly female in no acute distress.  She is alert and oriented x3. HEENT:  Normocephalic, atraumatic.  Sclerae clear, conjunctivae pink. Oropharynx clear. NECK:  Supple.  No thyromegaly, JVD or bruits. LUNGS:  Clear to auscultation bilaterally. HEART:  Irregularly irregular rhythm, 2/6 systolic ejection murmur at the apex. GI:  Soft, nontender, nondistended.  Positive bowel sounds. EXTREMITIES:  No clubbing, cyanosis or edema. SKIN:  No ecchymoses or lacerations. MUSCULOSKELETAL:  No deformity or atrophy.  LABORATORY DATA:  Potassium 3.9, creatinine 1.0, INR 1.5, hematocrit 34, platelets 183.  Chest x-ray is reviewed and reveals cardiomegaly without CHF.  Echocardiogram from 2008 reveals an ejection fraction of 60% at that time.  I have also reviewed the patient's EP study performed by Dr. Ladona Ridgel, on April 29, 2006, which revealed multiple atypical atrial flutter and left atrial flutter circuits which were not ablated.  At that time, she was observed to have sinus node dysfunction and was felt that she would likely  require pacemaker implantation long-term.  EKG from this hospitalization reveals atypical atrial flutter.  She also has an EKG from January 08, 2011, which reveals sinus rhythm.  IMPRESSION:  [name] is a very pleasant 77 year old female with longstanding atrial arrhythmias which have been difficult to treat.  She has failed antiarrhythmic therapies including amiodarone.  She has had difficulty recently with tachycardia bradycardia syndrome including an episode of recent syncope.  As she has had multiple episodes of documented bradycardia, I think that the most prudent strategy at this time would be to proceed with pacemaker implantation.  This will allow [place] to be more aggressive with heart rates for her atrial arrhythmias. Once her pacemaker has been implanted, I think that our initial approach should be to reestablish anticoagulation with Coumadin.  Given her prior stroke, I think that we should consider heparin bridge.  We will consider antiarrhythmic drug therapy long-term, however, I think her options are limited and it may be that rate control has a strategy long- term..  Risks, benefits and alternatives to pacemaker implantation were discussed at length with the patient today.  She understands that the risks include but not limited to infection, bleeding, vascular damage, pneumothorax, tamponade, perforation, lead dislodgement, renal failure, stroke, MI and death.  She accepts these risks and wishes to proceed. We therefore planned for pacemaker implantation at the next available time.    Hillis Range, MD    JA/MEDQ  D:  01/16/2011  T:  01/17/2011  Job:  [id number]  cc:   Colleen Can. Deborah Chalk, M.D.  Electronically Signed by Hillis Range MD on 01/27/2011 08:15:41 PM

## 2011-01-28 ENCOUNTER — Ambulatory Visit: Payer: Medicare FFS | Admitting: *Deleted

## 2011-01-28 ENCOUNTER — Encounter: Payer: Medicare FFS | Admitting: *Deleted

## 2011-01-30 ENCOUNTER — Ambulatory Visit: Payer: Medicare FFS | Admitting: *Deleted

## 2011-02-03 NOTE — H&P (Signed)
NAME:  [name], [name]               ACCOUNT NO.:  [id number]   MEDICAL RECORD NO.:  [id number]          PATIENT TYPE:  INP   LOCATION:  3701                         FACILITY:  [facility]   PHYSICIAN:  Bruce R. Juanda Chance, MD, FACCDATE OF BIRTH:  [birth year]   DATE OF ADMISSION:  09/09/2007  DATE OF DISCHARGE:                              HISTORY & PHYSICAL   PRIMARY CARDIOLOGIST:  [name]. Ladona Ridgel, MD   PRIMARY CARE PHYSICIAN:  Dr. Adele Barthel in [place].   HISTORY OF PRESENT ILLNESS:  A 77 year old Caucasian female with known  history of coronary artery disease status post MI, atrial flutter,  hypertension and status post angioplasty with bare metal stent to the  LAD in August of 2008 began to have chest pain around 2 p.m. today.  The  patient had eaten lunch at [employer] with her sister, had gotten up to  go to the bathroom.  On her way out of the bathroom the patient had a  presyncopal episode, everything started going grey.  She was a little  wobbly.  She suddenly began to have some substernal chest pain and  pressure radiating up into her neck with a choking feeling and radiation  down the left arm.  Her sister who was watching her come out of the  bathroom noticed her weaving, got up and helped her to a chair and the  patient expressed that she was having chest discomfort.  Her sister gave  her 1 sublingual nitroglycerin with no relief and then 5 minutes later  gave her a second sublingual nitroglycerin.  The patient states that the  greyness and dizziness went away and the chest discomfort diminished  some.  The patient's sister brought her immediately to [facility]  emergency room.  By the time she arrived at [facility] emergency room  the patient was chest pain-free.  She was seen and examined by ER  physician.  EKG was completed revealing normal sinus rhythm, ventricular  rate of 64 beats per minute with nonspecific inferior lateral ST  abnormality, mild depression noted.  She also  had a noted prolonged QT  with a QTC of 615 milliseconds.  The patient currently is pain-free and  without discomfort.  She was given 4 baby aspirin by the emergency room  physician and placed on oxygen.   REVIEW OF SYSTEMS:  Chest pain, presyncope, choking feeling, pain  radiating to the left arm, otherwise negative.   PAST MEDICAL HISTORY:  Atrial flutter unresponsive to cardioversion,  sinus node dysfunction on amiodarone and Coumadin, diastolic  dysfunction, hypertension, dyslipidemia, history of TIA, GERD,  depression.   PAST CARDIAC WORKUP:  The patient had a cardiac catheterization in  August of 2008 per Dr. Excell Seltzer which showed LAD being tortuous with a  long segment of what appeared to be spontaneous dissection in the distal  LAD.  There was an associated thrombus and severe stenosis of 90%.  The  patient was given Lovenox and intravenous Integrilin and p.o. Plavix.  The patient did have a 1.5 x 20 mm balloon passed and placement of a 20  x 23  mm MiniVision stent.  The patient's stent well expanded but  following the stenting there was an occlusion of a very atypical portion  of the LAD.  The patient did have moderate chest pain which resolved  shortly.  The patient has some very tiny distal portion apical LAD  occlusion.  It was suspected that the patient had embolization or  intramural hematoma as part of the primary process of the spontaneous  dissection.  The patient's other coronary arteries were nonobstructed.  Please see Dr. Casimiro Needle Cooper's thorough cardiac catheterization  dictation dated May 04, 2007, for more details.   SOCIAL HISTORY:  The patient lives in [place] but she is currently living  with her sister in [place] since August hospitalization.  She is  widowed.  She does not smoke.  She does not drink alcohol.  There is no  drug use.   FAMILY HISTORY:  Mother died of colon cancer.  Father died of a CVA.  She has a sister with CAD.   CURRENT  MEDICATIONS:  1. Aspirin 81 mg once a day.  2. Toprol-XL 50 mg daily.  3. Lisinopril 10 mg daily.  4. Zocor 10 mg daily.  5. Prozac 20 mg daily.  6. Lasix 40 mg daily.  7. Amiodarone 200 mg daily.  8. Coumadin 5 mg Monday, Wednesday, Friday and 2.5 mg on Saturday,      Sunday, Tuesday and Thursday.   ALLERGIES:  AMOXICILLIN causing itching and hives and LATEX.   CURRENT LABS:  Sodium 143, potassium 3.3 (she has been given potassium  40 mEq p.o.), chloride 106, CO2 28, BUN 22, creatinine 1.5, hemoglobin  12.9, hematocrit 38, CK 99.4, MB less than 1.0, troponin less than 0.05.  EKG revealing normal sinus rhythm with nonspecific ST-T wave changes  noted inferior laterally most prominent lead 2 and V5 and V6 with a  prolonged QTC of 615 milliseconds.  Prior EKG had normal QT interval of  43.   PHYSICAL EXAMINATION:  VITAL SIGNS:  Blood pressure 103/66, heart rate  56, respirations 20, temperature 97.6, O2 sat 91% on room air.  HEENT:  Head is normocephalic, atraumatic.  Eyes PERRLA.  Mucous  membranes and mouth pink and moist.  Tongue is midline.  NECK:  Supple.  There is no JVD.  No carotid bruits appreciated.  CARDIOVASCULAR:  Regular rate and rhythm without murmurs, rubs or  gallops.  LUNGS:  Clear to auscultation.  ABDOMEN:  Soft, nontender, no rebound or guarding.  Bowel sounds 2+ are  noted.  EXTREMITIES:  Without clubbing, cyanosis or edema.  She is complaining  of a bruise on the back of her right knee that is painful to touch.  She  does not remember injuring it.  NEUROLOGICAL:  Cranial nerves II-XII are grossly intact.   IMPRESSION:  1. Angina in patient with known coronary artery disease status post      bare metal stent to the left anterior descending in August of 2008.  2. Prolonged QT interval with QTC of 615 milliseconds.  3. Hypokalemia at 3.3 which has been repleted.  4. Atrial fibrillation.  5. Hypertension.   PLAN:  The patient has been seen and examined by  myself and Dr. Everardo Beals.  Brodie in the emergency room.  The plan will be to admit to telemetry.  Discussion with Dr. Duke Salvia by Dr. Juanda Chance concerning prolonged  QT interval on amiodarone was done.  It was then suggested by Dr. Graciela Husbands  that the patient  have a repeat of her BMET and check her magnesium to  evaluate abnormalities and replete if necessary.  We will follow up with  an EKG in the morning to make a decision concerning need to continue  amiodarone versus decreasing the dose from 200 to 100 mg daily.   The patient will be planned for cardiac catheterization on September 12, 2007, in a setting of known coronary artery disease with angina.  In the  interim the patient's Coumadin will be placed on hold.  We will start  her on Lovenox.   The patient will also be monitored for hypertension and her labs will be  evaluated daily.  Plan will be to stop the Lovenox on day of  catheterization with monitoring over the weekend for any additions or  changes in medications.  This has been discussed with the patient who  verbalizes understanding.      Bettey Mare. Lyman Bishop, NP      Everardo Beals. Juanda Chance, MD, [facility]  Electronically Signed    KML/MEDQ  D:  09/09/2007  T:  09/11/2007  Job:  [id number]   cc:   Everardo Beals. Juanda Chance, MD, [facility]  Adele Barthel, Dr

## 2011-02-03 NOTE — Cardiovascular Report (Signed)
NAMEBRADLEY, HANDYSIDE               ACCOUNT NO.:  [id number]   MEDICAL RECORD NO.:  [id number]          PATIENT TYPE:  INP   LOCATION:  2915                         FACILITY:  [facility]   PHYSICIAN:  Bevelyn Buckles. Bensimhon, MDDATE OF BIRTH:  Jun 03, 1933   DATE OF PROCEDURE:  09/12/2007  DATE OF DISCHARGE:                            CARDIAC CATHETERIZATION   CARDIOLOGIST:  [name]. Ladona Ridgel, MD.   PRIMARY CARE PHYSICIAN:  Dr. Molly Maduro Prior in [place], [place].   HISTORY:  [name] is a delightful 77 year old woman who suffered an  ST elevation myocardial infarction in August 2008.  She underwent  catheterization by Dr. Excell Seltzer.  This showed a spontaneous dissection of  the distal LAD.  A 20 x 23 mm mini Vision bare metal stent was placed.  The remainder of her coronaries were unremarkable.  She also has a  history of atrial flutter and is on Coumadin.  She was admitted with  chest pain similar to her previous angina.  EKG showed nonspecific ST-T-  wave abnormalities.  Cardiac markers have been normal.  She was referred  for the cardiac catheterization lab for diagnostic angiography.   PROCEDURES PERFORMED:  1. Selective coronary angiography.  2. Left heart catheterization.  3. Left ventriculogram.  4. Thoracic aortogram.   DESCRIPTION OF THE PROCEDURE:  The risks and indication of the  catheterization were explained, consent was signed, and placed on the  chart.  A 5-French arterial sheath was placed in right femoral artery  using a modified Seldinger technique.  Standard catheters including a JL-  4, JR-4, and angled pigtail were used.  Of note, all of the sheath was  placed easily.  We did have some difficulty maneuvering the catheters  into the thoracic aorta.  We ended up using a Wholey wire in the right  coronary catheter, and were able to reach the ascending aorta.  Subsequently all catheter exchanges were made over a long exchange wire.  There are no apparent complications.   Central aortic pressure was 122/63 with a mean of 87.  LV pressure LV  pressure was 136/1 with an EDP of 14.  There was no aortic stenosis.   FINDINGS:  1. Left main was normal.  2. LAD was a long vessel coursing to the apex.  There was a stent in      the very apical LAD.  This was small and hard to visualize.  It had      about a 50%-60% InStent restenosis in the proximal portion.  Within      the proximal-to-mid LAD there was somewhat of a hazy lesion with      what appeared to be some flow obstruction.  There is really TIMI 2      flow throughout the vessel.  On most shots this lesion appeared to      be nonobstructive in the 30%-40% range.  However, on the steep left      lateral view, there appeared to be a possible napkin-ring lesion      with a 70%-80% lesion.  3. Left circumflex gave off a large bifurcating  ramus.  The upper      portion bifurcated, again, to make essentially a trifurcation.      There was a 30% lesion at the lower portion of the upper branch of      the ramus.  4. Circumflex.  The circumflex, itself, was small-to-moderate size      with no other branches, and angiographically normal.  5. Right coronary artery was a large dominant vessel.  It gave off 2      small RV branches, a PDA, and 2 posterolaterals.  There is a 30%      lesion on the proximal portion.   LEFT VENTRICULOGRAM:  Done in the RAO position showed an ejection  fraction of 50%-55%.  There was a high mild hypokinesis.   THORACIC AORTOGRAM:  Showed moderate plaquing throughout.  It was quite  tortuous.  I had seen that the catheter wove in-and-out of a false lumen  in both the abdominal aorta and the iliacs, this was concerning for  preexisting dissection.  There is no evidence of contrast extravasation.   ASSESSMENT:  1. Probable high-grade lesion in the proximal LAD with TIMI 2 flow as      described above.  2. Mild-to-moderate InStent restenosis of the distal LAD stent.  3. Probable  aortoiliac dissection.   I reviewed the case with Dr. Riley Kill.  I have also discussed with Dr.  Fabienne Bruns of vascular surgery.  We will check a stat CT angiogram  of the aorta to evaluate for dissection.  We will follow this up with  Dr. Darrick Penna.  We will also send off serology to rule out underlying  connective tissue disease or vasculitis.  She will eventually need an  IVUS to evaluate her LAD lesion.   Dictation ended at this point.      Bevelyn Buckles. Bensimhon, MD  Electronically Signed     DRB/MEDQ  D:  09/12/2007  T:  09/12/2007  Job:  [id number]   cc:   Rhunette Croft Dr. Molly Maduro Prior

## 2011-02-03 NOTE — Assessment & Plan Note (Signed)
OFFICE VISIT   [name], [name]  DOB:  1932-11-29                                       07/17/2009  ZOXWR#:[id number]   The patient is a 77 year old female who returns for follow-up today.  She was last seen in January 2010.  We have been following her for  chronic aortic dissection.  She is referred back today by her doctors in  [place] for evaluation for nausea and weight loss.  The patient states  that she has had nausea for approximately 2 years.  This is  intermittently worse on occasion.  It apparently had become worse over  the last month and she has had a 14 pounds weight loss.  She denies any  abdominal pain.  She denies any postprandial abdominal pain.  She denies  any food fear.  She states that she just eats very small meals and has  very early satiety.  Previous CT scan of abdomen, pelvis in January  showed that the aneurysm was between 3.2 and 3.5 cm in diameter with no  compromise of the visceral vessels.  She apparently had a gallbladder  ultrasound results of these are not available for review today but she  stated that these were negative.  She apparently also has had a recent  endoscopy that she stated was negative.  She is also had some balance  issues in the past and has been walking with a walker.  She also has  mild to moderate dementia.   PAST MEDICAL HISTORY:  Significant for atrial fibrillation.  She also  has a history of hypertension, hyperlipidemia, TIA, reflux and mild  depression.   FAMILY HISTORY:  Remarkable for mother who died of colon cancer.  Her  father died of stroke and her sister who has coronary artery disease.   REVIEW OF SYSTEMS:  Full 14 for review of systems was performed.  Please  see intake referral form for details.  Is remarkable for some dysphagia  and reflux.  She also has some hoarseness.  Again she also has a history  of mild depression.   PHYSICAL EXAM:  Blood pressure 143/94 the right arm, 144/105  the left  arm, heart rate 89 and regular, respirations 24.  HEENT:  Unremarkable.  Neck:  Has 2+ carotid pulses without bruit.  Chest:  Clear to  auscultation.  Heart:  Exam is regular rate and rhythm without murmur.  Abdomen:  Soft with some tenderness over a palpable pulsatile mass in  the left epigastrium.  Extremities:  She also has some right lower  quadrant tenderness.  She has 2+ radial and femoral pulses bilaterally.  She has a 2+ left dorsalis pedis and posterior tibial pulse.  She has  absent pedal pulses on the right side.  She has no significant lower  extremity edema.  Neurologic:  Exam shows symmetric upper extremity  lower extremity motor strength which is 5/5 bilaterally.   I reviewed her CT scan of the abdomen and pelvis which was sent from the  [facility].  This shows a chronic aortic dissection with aortic diameter 3.5  cm in diameter.  There is filling of all visceral vessels and the renal  vessels.  The remainder of this CT scan was fairly unremarkable.   In summary, the patient has a chronic type B dissection.  She has  chronic nausea  and vomiting, but does not seem to have significant  abdominal pain associated with this.  The aneurysm diameter of  dissection is not large enough that I would consider repair at this time  especially in light of her overall state which is not really fit for the  extent of operation that would require to fix this.  I am not under the  impression currently that she has had mesenteric compromise from her  dissection as the visceral vessels fill easily on the CT scan and there  is no significant evidence of stenosis.  I believe that the best option  at this time would be further workup of her GI system.  I have referred  her at her request to Dr. Vida Rigger with [facility] to see if he has  any further insight into what may be causing her nausea problems.  She  will follow up with me in 1 year for CT angio of chest, abdomen and  pelvis to be  able to view the entire extent of her dissection as well as  follow-up on previous lung nodule.  She will return sooner if she has  any significant new problems.  All this was discussed with her family  members today as the patient does have a significant memory loss short  term.   Janetta Hora. Fields, MD  Electronically Signed   CEF/MEDQ  D:  07/17/2009  T:  07/18/2009  Job:  [id number]   cc:   [facility] [place] Dr. Molly Maduro Prior  Petra Kuba, M.D.

## 2011-02-03 NOTE — Assessment & Plan Note (Signed)
[facility]                            CARDIOLOGY OFFICE NOTE   [name]                  MRN:          [id number]  DATE:05/31/2007                            DOB:          [birth year]    [name] presents for followup at the [facility] office on  May 31, 2007.  She has been recently hospitalized at [facility]  with a non-Q wave myocardial infarction.  She had a spontaneous distal  left anterior descending dissection and was treated with angioplasty and  stenting.  One week after her intervention, she developed acute onset of  visual changes, slurred speech, and disorientation.  She was found to  have multiple strokes.  She was treated with continued antiplatelet  therapy with aspirin and clopidogrel in the setting of her recent  stenting.  She also has been treated with Coumadin in the setting of  paroxysmal atrial fibrillation.  She had been off of Coumadin for  several months prior to her initial presentation with an acute coronary  syndrome.  Her Coumadin had been held because of severe epistaxis with  anemia.  We elected to discharge her from the hospital off of Coumadin  as she remained in sinus rhythm and had prior problems with bleeding.  Her Coumadin has now been restarted after her most recent  hospitalization per the neurology service.  Dr. Ladona [name] saw her back in  hospital followup and has asked for an opinion regarding her  antiplatelet therapy.   From a symptomatic standpoint, [name] is having difficulty with her  visual field deficits and nausea and vomiting that she relates to her  visual problems.  She feels somewhat unsteady on her feet as well.  She  has not had any chest pain or dyspnea.   MEDICATIONS:  1. Aspirin 81 mg daily.  2. Nexium 40 mg daily.  3. Coumadin as directed.  4. Lisinopril 10 mg daily.  5. Fluoxetine 20 mg daily.  6. Simvastatin 40 mg daily.  7. Amiodarone 200 mg  daily.  8. Plavix 75 mg daily.  9. Lasix 40 mg twice daily.   ALLERGIES:  AMPICILLIN and LATEX.   PHYSICAL EXAMINATION:  She is alert and oriented in no acute distress.  Weight 178, blood pressure 124/84, heart rate 60 and respiratory rate is  16.  NECK:  Normal carotid upstrokes without bruits.  Jugular venous pressure  is normal.  LUNGS:  Clear to auscultation bilaterally.  HEART:  Regular rate and rhythm without murmurs or gallops.  ABDOMEN:  Soft, nontender, no organomegaly.  EXTREMITIES:  No cyanosis, clubbing, or edema.  Peripheral pulses 2+ and  equal throughout.   ASSESSMENT:  1. Recent embolic strokes.  2. Non-Q wave myocardial infarctions, status post angioplasty and bare      metal stent placement in the distal left anterior descending.  3. History of left atrial fibrillation, maintaining sinus rhythm on      amiodarone.   DISCUSSION:  Unfortunately, [name] has suffered a stroke which is  likely embolic.  It appears that multiple cerebral territories were  involved.  The potential sources  would be:  1. Atheroembolic from her recent catheterization and percutaneous      intervention.  2. Cardioembolic secondary to atrial dysrhythmia.  3. Cardioembolic secondary to stunning of the left ventricular apex.      Her ventriculogram and her echocardiogram have not demonstrated      akinesis or hypokinesis of the left ventricular of the apex, but it      is possible that she had transient stunning of this area in the      setting of her recent infarct.   While it is difficult to determine the exact etiology, I think her most  appropriate long term antiplatelet and antithrombotic regimen would  include Coumadin and low-dose aspirin at 81 mg.  She is nearly 30 days  out from her bare metal stent placement, and I would favor discontinuing  her clopidogrel when she reaches 30 days from her coronary intervention.  There is data for 12 months of clopidogrel in the setting of  non-Q wave  myocardial infarction, but I think the bleeding risk of all 3 drugs  outweighs the potential benefit.     [name]. Robin Seltzer, MD  Electronically Signed    MDC/MedQ  DD: 06/01/2007  DT: 06/02/2007  Job #: [id number]   cc:   [name]. Robin Ridgel, MD

## 2011-02-03 NOTE — Assessment & Plan Note (Signed)
[facility]                         ELECTROPHYSIOLOGY OFFICE NOTE   ZALEY, TALLEY                  MRN:          [id number]  DATE:09/26/2008                            DOB:          November 14, 1932    [name] returns today for followup.  He is a very pleasant, 77-year-  old woman with a history of atrial flutter (left atrial) as well as  coronary disease and anemia who returns today for followup.  The patient  had previously had symptomatic atrial flutter.  She underwent EP study  demonstrating left atrial arrhythmias.  She did not undergo ablation  therapy.  The patient subsequently developed left atrial flutter at a  ventricular rate of 100 beats per minute.  She returns today for  followup.  She has no awareness that her heart is at a rhythm.  She has  moved back to [place], [place] where she has previously lived and is  doing well there otherwise.  She denies chest pain or shortness of  breath.   MEDICINES:  1. Aspirin 81 a day.  2. Nexium 40 a day.  3. Coumadin as directed.  4. Lisinopril 10 a day.  5. Metoprolol 25 daily.  6. Xalatan eye drops.  7. Simvastatin 10 a day.  8. Fluoxetine 40 a day.  9. Furosemide 20 a day.  10.The patient is no longer on amiodarone.   PHYSICAL EXAMINATION:  GENERAL:  She is a pleasant 77 year old woman in  no acute distress.  VITAL SIGNS:  Blood pressure was 102/80, the pulse was 100 and regular,  respirations were 18, the weight was not recorded.  NECK:  No jugular venous distention.  There is no thyromegaly.  Trachea  is midline.  Carotids are 2+ and symmetric.  LUNGS:  Clear bilaterally to auscultation.  No wheezes, rales, or  rhonchi are present.  No increased work of breathing.  CARDIOVASCULAR:  Irregular tachycardia with normal S1 and S2.  There is  a grade 2/6 murmur of aortic insufficiency.  ABDOMEN:  Soft, nontender.  EXTREMITIES:  No edema.   EKG demonstrates atypical atrial flutter  with rapid ventricular response  of 100 beats per minute.   IMPRESSION:  1. Recurrent (asymptomatic) atrial flutter off of amiodarone.  2. Coronary artery disease.  3. History of stroke involving her visual fields.   DISCUSSION:  [name] is stable.  She is no longer on amiodarone.  She is not symptomatic with regards to her flutter.  I am concerned  about cardiomyopathy and for this reason, I have asked that she increase  her metoprolol to 25 twice daily.  Also, I started her on digoxin 0.125  daily.  I will see the patient back in 2 months.     [name]. Ladona Ridgel, MD  Electronically Signed    GWT/MedQ  DD: 09/26/2008  DT: 09/27/2008  Job #: [id number]

## 2011-02-03 NOTE — Assessment & Plan Note (Signed)
OFFICE VISIT   [name], [name]  DOB:  Apr 16, 1933                                       04/18/2008  WRUEA#:[id number]   The patient is a 77 year old female who was seen in December of 2008 at  which time she had developed an aortic dissection.  She is seen today  for further followup.  She denies any chest, abdomen or back pain that  is new in character.  She has had no weight loss.  She has no  claudication symptoms.   PHYSICAL EXAM:  Vital signs:  Blood pressure is 149/93 in the left arm.  Pulse is 61 and regular.  HEENT:  Unremarkable.  Neck:  She has 2+  carotid pulses without bruit.  Chest:  Clear to auscultation.  Cardiac:  Is regular rate and rhythm without murmur.  Abdomen:  Is soft,  nontender, nondistended.  There are no bruits.  Extremities:  She has 1+  radial pulses bilaterally.  She has 2+ femoral pulses bilaterally.  She  has absent popliteal and pedal pulses bilaterally.   She states that her most recent serum creatinine was 1.6.  Her CT scan  of the chest, abdomen and pelvis from today is reviewed.  This shows a  descending thoracic aortic dissection which extends all of the way down  into the right external iliac artery.  The largest diameter of the aorta  is 32 mm in diameter and this is near the takeoff of the visceral  vessels.  The celiac SMA and IMA all fill normally.  The left and right  renal arteries fill normally.  There is adequate perfusion of the lower  extremities.   The patient has a stable aortic dissection.  She has no end organ  involvement currently.  The aortic diameter is 3.2 cm in diameter.  I  believe she should have continued medical management of this.  We will  continue to follow her aneurysm long-term to see if there is any  expansion or if she has end organ compromise at some point in the  future.  She will return in six months' time for repeat CT scan of the  chest, abdomen and pelvis.   Janetta Hora.  Fields, MD  Electronically Signed   CEF/MEDQ  D:  04/19/2008  T:  04/19/2008  Job:  [id number]   cc:   [name]. Ladona Ridgel, MD  Dr Adele Barthel

## 2011-02-03 NOTE — H&P (Signed)
[name], [name]               ACCOUNT NO.:  [id number]   MEDICAL RECORD NO.:  [id number]          PATIENT TYPE:  INP   LOCATION:  4733                         FACILITY:  [facility]   PHYSICIAN:  Adela Ports, MD   DATE OF BIRTH:  May 09, 1933   DATE OF ADMISSION:  05/04/2007  DATE OF DISCHARGE:                              HISTORY & PHYSICAL   CHIEF COMPLAINT:  Chest pain.   HISTORY OF PRESENT ILLNESS:  [name] is a 77 year old woman who is  transferred from [facility] in [place], [place] after  presenting on May 02, 2007 with one hour of substernal chest pain  associated with nausea, vomiting, diaphoresis and shortness of breath.  Her chest pain resolved with nitrates.  Cardiac markers were initially  negative, but troponin I peaked at 3.1 subsequently and CK at 151 and MB  at 5.5.  Of note, the patient's blood pressure on arrival was 216/107.   [name] reportedly had a normal cath in 2007 from her cardiology  clinic notes, though no record of this catheterization is on file.  Her  cardiac risk factors include hypertension, dyslipidemia and age.   PAST MEDICAL HISTORY:  1. Atrial fibrillation and flutter status post EP study showing      multiple pathways of atypical flutter unresponsive to      cardioversion.  She is currently treated on amiodarone and Coumadin      for this and has sinus node dysfunction additionally.  2. Diastolic congestive heart failure.  3. Reported coronary angiography showing no coronary artery disease.  4. Hypertension.  5. Dyslipidemia.  6. History of TIA.  7. Gastroesophageal reflux disease.  8. Depression.  9. Recent episode of severe epistaxis leading to acute anemia.  10.Status post hysterectomy.  11.Status post knee surgery.   SOCIAL HISTORY:  Lives in [place], [place].  Widowed.  No tobacco, alcohol or  drugs.   FAMILY HISTORY:  Mother died of colon cancer.  Father had a CVA.  Had a  sister with adult-onset CAD.   ALLERGIES:  AMOXICILLIN CAUSES HIVES.   MEDICATIONS ON TRANSFER:  1. Lovenox 1 mg/kg q.12 hours.  2. Amiodarone 200 mg p.o. daily.  3. Prozac 20 mg p.o. daily.  4. Lasix 40 mg p.o. b.i.d.  5. Simvastatin 40 mg p.o. daily.  6. Aspirin 325 mg p.o. daily.  7. Nitrostat p.r.n.  8. Aldactone 25 mg p.o. b.i.d.   CLINICAL DATA FROM OUTSIDE HOSPITAL:  EKGs August 11 through 12 showed  normal sinus rhythm at 52 through 63 with nondiagnostic Q waves in the  lateral leads and  dynamic ST-T wave changes anterolaterally.   Echocardiogram August 12 was technically limited, but showed normal LV  and RV function and sizes and normal valvular function.   LABORATORY RESULTS:  BNP 156.  TSH 6.8, T4 14.2, both elevated.  CBC  with platelets 259, hematocrit 35.8.  Basic metabolic panel notable for  a potassium of 3.7, creatinine of 1.4.  Extended metabolic panel with  normal liver function testing.  INR 0.9, PTT 26.8.  Lipid panel with  triglycerides  57, LDL 60, HDL 50.  Urinalysis negative.   EXAMINATION:  VITAL SIGNS:  Temperature 98.1, pulse 70, respiratory rate  18, blood pressure 116/68, oxygen saturation 98% on room air.  GENERAL:  [name] is a pleasant woman in no apparent distress, alert  and oriented x3.  HEENT:  Extraocular movements intact.  Mucous membranes moist.  JVP is  flat.  LUNGS:  Clear to auscultation bilaterally.  HEART:  Regular rate and rhythm.  No murmurs, rubs or gallops.  ABDOMEN:  Soft, nontender, nondistended.  Positive bowel sounds.  EXTREMITIES:  No clubbing, cyanosis or edema.  2+ pulses in the femoral,  DP, radial and carotid areas bilaterally without bruits.   ASSESSMENT/PLAN:  Ms. Sheets is a 77 year old woman with a reportedly  negative cath in the past two years who ruled in for non-ST-elevation  myocardial infarction in the setting of hypertensive urgency and  emergency.  Chest pain resolved quickly with initiation of nitrates and  has not returned.    1. Acute coronary syndrome:  Whether this event represents      hypertensive emergency or an acute coronary syndrome is difficult      to say although she did have a negative cath not too long ago.  At      this time, however, we will treat this as an acute coronary      syndrome and continue aspirin, statin, Lovenox.  We will add a beta-      blocker and treat recurrent chest pain with nitrates p.r.n.  We      will also load with Plavix tonight prior to catheterization likely      tomorrow morning.  In preparation for catheterization, the patient      will be NPO after midnight.  2. Blood pressure:  Patient presented with severely elevated blood      pressure and associated chest pain and positive troponin.  We will      continue her beta-blocker and add an ACE inhibitor at this time.      Reportedly, her left ventricular function was normal in the past.  3. History of atrial fibrillation/flutter:  The patient is currently      in normal sinus rhythm.  She has been treated with amiodarone and      Coumadin.  The Coumadin was stopped over the past few weeks for a      colonoscopy and has not been restarted since then.  Pending the      results of her catheterization, we can restart the Coumadin      although she has had a history of severe epistaxis in the past      related to Coumadin.  The patient has also been treated with      amiodarone, but thyroid function testing at the outside hospital      was abnormal with an elevated TSH as      well as an elevated T4.  We will recheck her thyroid function tests      here and if they are indeed abnormal, then we will need to consider      stopping her amiodarone and choosing another strategy for atrial      fibrillation/flutter control.      Adela Ports, MD  Electronically Signed     DWM/MEDQ  D:  05/04/2007  T:  05/04/2007  Job:  [id number]

## 2011-02-03 NOTE — Assessment & Plan Note (Signed)
[facility]                         ELECTROPHYSIOLOGY OFFICE NOTE   [name], [name]                  MRN:          [id number]  DATE:05/20/2007                            DOB:          1932-11-06    [name] returns today for follow-up.  She is a very pleasant 77-year-  old woman with a history of coronary artery disease, hypertension,  dyslipidemia, atrial flutter (left atrial), and heart failure, who was  hospitalized earlier this month with an acute non-Q-wave myocardial  infarction and found to have a distal LAD coronary dissection.  She  underwent angioplasty and stenting of this vessel with persistent  occlusion of the apical LAD.  The patient remained stable post procedure  but, unfortunately, after discharge had visual changes, slurring of her  speech and disorientation and was subsequently readmitted and found to  have multiple strokes, thought to be embolic in nature.  These occurred  within a week of her catheterization.  The patient subsequently was  treated with anticoagulation (she had previously been on  anticoagulation) but had to come off of it prior to her catheterization  and careful control of her blood pressure, and had initiation of  physical therapy begun.  She now returns for additional evaluation.  She  has had no syncope but her sister, who she lives with now, who lives in  [place], states that she at times becomes disoriented and has trouble  remembering where she is.  Her vision is also still decreased.  She has  very little peripheral vision, by her report.  She has undergone home  physical, occupational and speech therapy.   PHYSICAL EXAMINATION:  She is a pleasant 77 year old woman in no acute  distress.  The blood pressure today was 110/80, the pulse was 60 and regular, the  respirations were 18, the weight was 180 pounds.  NECK:  No jugular venous distention.  There was no thyromegaly.  The  trachea was  midline.  The carotids were 2+ and symmetric.  LUNGS:  Clear bilaterally to auscultation.  No wheezes, rales or rhonchi  present.  CARDIOVASCULAR:  A regular rate and rhythm with normal S1 and S2.  There  were no murmurs, rubs or gallops present.  EXTREMITIES:  No peripheral edema.   MEDICATIONS:  1. Aspirin 81 mg daily.  2. Nexium 40 mg daily.  3. Coumadin as directed.  4. Lisinopril 10 mg daily.  5. Plavix 75 mg daily.  6. Furosemide 40 mg twice daily.  7. Amiodarone 200 mg daily.  8. She is also on simvastatin 40 mg a day.   IMPRESSION:  1. Recent multiple embolic strokes.  2. Non-Q-wave myocardial infarction, status post angioplasty and      stenting.  3. History of left atrial flutter on amiodarone, with nice control.  4. Chronic Coumadin therapy secondary to #1, 2 and 3.   DISCUSSION:  Overall, Ms. Gora remains stable but still has residual  defects from her multiple embolic strokes.  She will continue on with  her physical therapy.  I have asked that she follow up with Veverly Fells.  Excell Seltzer, MD, to give  a sense as to whether she needs to be on Plavix long-  term or if we can stop it within a month or two following her non-drug-  eluting stents.  I will see her back in several months.     [name]. Ladona Ridgel, MD  Electronically Signed    GWT/MedQ  DD: 05/20/2007  DT: 05/22/2007  Job #: [id number]

## 2011-02-03 NOTE — H&P (Signed)
[name], [name]               ACCOUNT NO.:  [id number]   MEDICAL RECORD NO.:  [id number]          PATIENT TYPE:  INP   LOCATION:  3029                         FACILITY:  [facility]   PHYSICIAN:  Casimiro Needle L. Reynolds, M.D.DATE OF BIRTH:  1932-12-25   DATE OF ADMISSION:  05/11/2007  DATE OF DISCHARGE:                              HISTORY & PHYSICAL   CHIEF COMPLAINT:  Headache and confusion.   HISTORY OF PRESENT ILLNESS:  This is the initial [facility] admission for this 77 year old woman with a past history which  includes several medical problems including a recent MI with coronary  stenting done at [facility] on May 04, 2007.  She was  discharged from hospital on May 06, 2007.  She was staying with her  sister here locally.  She states that she has been feeling unwell all  day.  She got early this morning, was nauseated and vomiting.  She felt  a little bit better this afternoon, but to some point in the afternoon  she developed a headache.  Her headache progressed to the point that she  complained about it to her sister about 10:45 p.m. this evening.  Around  that time, she also got up out of a chair to go to her room, but  wondered into another room and seemed confused.  Her sister also noted  she had some slurred speech at that time.  She became concerned and  brought the patient to the hospital.  At that time, slurred speech was  resolved and she complained only of a headache.  She initially was  worked up for temporal arteritis and her visual fields were not  examined.  She then had a CT of the head which read out as showing a  subacute stroke in the left parieto-occipital area and I was asked to  admit the patient.  By my examination, she definitely has a right-sided  hemianopsia, particularly involving the left upper quadrant, but by this  point, she is out of the window for intravenous TPA.  She has no other  focal deficits at this time.  She  has received morphine and Compazine  for her headache and that is feeling better.  She denies any associated  palpitations or chest pain with this episode.   PAST MEDICAL HISTORY:  She has no known history of stroke.  She has  known history of coronary artery disease with recent stenting as above.  She has history of atrial fibrillation and atrial flutter which for  which she had several EP studies.  She sees Dr. Ladona Ridgel for this and is  on amiodarone.  She states that she has been anticoagulated in the past  and her records indicate that she was to leave the hospital on  therapeutic Coumadin.  However, she tells me that she is not taking  Coumadin since she had a severe nose bleed several months ago.  Other  medical problems include hypertension, hyperlipidemia, diastolic CHF,  gastroesophageal reflux disease and depression.  On her last admission,  she had significant elevated creatinine in the 1.6-1.7  range suggestive  of chronic renal insufficiency.   MEDICATIONS:  1. Amiodarone 200 mg daily.  2. Prozac 20 mg daily.  3. Metoprolol XL 50 mg daily.  4. Plavix 75 mg daily.  5. Zocor 40 mg daily.  6. Lasix 40 mg daily at 8 a.m. and 2 p.m.  7. Lisinopril 10 mg daily.  8. Aspirin 81 mg daily.  9. Nitroglycerin p.r.n.   ALLERGIES:  PENICILLIN.   FAMILY HISTORY:  Remarkable for stroke in her father and colon cancer in  her mother.   SOCIAL HISTORY:  She is widowed.  She lives in [place], but is staying  with her sister since she had her coronary procedure.  No history of  tobacco, alcohol or drug use.   REVIEW OF SYSTEMS:  NEUROLOGIC:  As above.  CARDIOVASCULAR:  No chest  pain or palpitations.  PULMONARY:  No shortness of breath.  GI: Nausea  and vomiting this morning.  GU/MUSCULOSKELETAL/ENDOCRINE/HEMATOLOGIC/PSYCHIATRIC:  All negative  except as above.   PHYSICAL EXAMINATION:  VITAL SIGNS:  Temperature 97.3, blood pressure  137/77, pulse 50, respirations 16, O2 saturations 99%  on room air.  GENERAL:  This is a healthy-appearing woman supine in the hospital bed  with no evidence of distress.  HEENT:  Normocephalic, atraumatic.  Oropharynx benign.  NECK:  Supple without carotid bruits.  CHEST:  Few bibasilar crackles bilaterally.  HEART:  Bradycardic, regular rhythm, no murmurs.  ABDOMEN:  Soft, normoactive bowel sounds.  EXTREMITIES:  Trace edema, 2+ pulses.  NEUROLOGIC:  Mental status:  She is awake and alert.  She is not able to  tell me what month it is, but gets the year and place correct.  She is  able to name obvious and repeat phrases.  Cranial nerves with pupils 3  mm and briskly reactive.  Extraocular movements full without nystagmus.  Examination of visual fields reveals a left homonymous hemianopsia which  is dense in the upper quadrant and less so in the lower quadrant with a  little bit at the median.  She has a slight left facial droop and no  weakness.  Tongue and palate move normally and symmetrically.  Normal  bulk and tone.  Normal strength.  Sensation intact to light touch,  pinprick and double simultaneous stimulation in all extremities.  Coordination:  Finger-to-nose performed accurately.  Reflexes:  Toes  downgoing bilaterally.   LABORATORY DATA:  CBC with white count 7.7, hemoglobin 11.9, platelets  244,000.  BMET is remarkable for a BUN and creatinine of 23 and 1.65  respectively with a glucose elevated at 125.  Electrolytes are normal.  Sedimentation rate is 26.   CT of the head is reviewed.  There is a clearly delineated, subacute  stroke in the left occipital area just over the left parieto-occipital  area just over the lateral ventricle which appears subacute.  I would  believe that there is hyperdensity in the right medial occipital area  more suggestive of acute stroke as well.  This is not called by the  radiologist.   IMPRESSION:  1. Acute right posterior cerebral artery and subacute left posterior      cerebral artery  strokes, probably cardioembolic secondary to a      history of atrial fibrillation.  2. Coronary artery disease with recent stenting.  3. History of fibrillation/atrial flutter.  4. Diastolic congestive heart failure.  5. Hypertension.  6. Hyperlipidemia.  7. Gastroesophageal reflux disease.  8. History of depression.   PLAN:  Will  admit to the hospital for a stroke workup.  Will check a 2-D  echocardiogram and Dopplers.  Will try to get an MRI assuming it is safe  given that she had a recent coronary stent.  She will need to be started  on heparin and converted to Coumadin.  Stroke service to follow.      Michael L. Thad Ranger, M.D.  Electronically Signed     MLR/MEDQ  D:  05/12/2007  T:  05/13/2007  Job:  [id number]

## 2011-02-03 NOTE — Discharge Summary (Signed)
NAMEBRITTINEE, [name]               ACCOUNT NO.:  [id number]   MEDICAL RECORD NO.:  [id number]          PATIENT TYPE:  INP   LOCATION:  6522                         FACILITY:  [facility]   PHYSICIAN:  Veverly Fells. Excell Seltzer, MD  DATE OF BIRTH:  13-Oct-1932   DATE OF ADMISSION:  05/03/2007  DATE OF DISCHARGE:  05/06/2007                               DISCHARGE SUMMARY   PRIMARY CARDIOLOGIST:  Dr. Gilman Schmidt.   DISCHARGE DIAGNOSIS:  Non-ST-elevation myocardial infarction.   SECONDARY DIAGNOSES:  1. Coronary artery disease.  2. Hypertension.  3. Hyperlipidemia.  4. Chronic diastolic congestive heart failure.  5. History of transient ischemic attack.  6. Gastroesophageal reflux disease.  7. Depression.  8. History of atypical atrial flutter, on amiodarone and Coumadin      therapy.  9. Anemia in the setting of a recent epistaxis.  10.Status post hysterectomy.  11.History knee surgery.  12.Hypothyroidism/sick euthyroid.   ALLERGIES:  AMOXICILLIN CAUSES HIVES.   PROCEDURES:  Left heart cardiac catheterization with successful PCI and  stenting of the distal LAD, with placement of a bare metal stent.   HISTORY OF PRESENT ILLNESS:  A 77 year old Caucasian female with prior  history of atypical atrial flutter, hypertension and hyperlipidemia, who  presented to [facility] in [place], [place] on May 02, 2007 with chest pain, with associated nausea, vomiting, diaphoresis,  shortness of breath.  Chest pain resolved with nitrates, and she had an  elevated troponin of 3.1 with a CK 151, MB of 5.5.  Notably, she was  markedly hypertensive on admission, with a BP of 216/107.  Decision was  made to transfer to [facility] for further evaluation and management of  non-ST-elevation MI.   HOSPITAL COURSE:  Following arrival, the patient underwent left heart  cardiac catheterization on August 13, revealing 80% to 90% stenosis,  with spontaneous dissection and thrombus in the  distal LAD, with an  occluded apical LAD.  She otherwise had nonobstructive disease.  Her EF  of 65% without regional wall motion abnormalities.  Attention was turned  to the distal LAD, which was successfully stented with a 2.0 x 23-mm  mini Vision bare metal stent.  She tolerated this procedure well and  post-procedure has been ambulating, without recurrent symptoms or  limitations.  She is being seen by cardiac rehab and is ready for  discharge today.   Notably on admission, the patient was found to have a TSH of 5.922, with  a normal free T4 at 1.29.  We recommended followup PFTs in approximately  6 weeks.   DISCHARGE LABS:  Hemoglobin 12.3, hematocrit 36.5, WBC 7.9, platelets  262, MCV 83.8, sodium 141, potassium 3.8, chloride 106, CO2 26, BUN 21,  creatinine 1.40, glucose 97, INR of 1.0, total bilirubin 0.7, alkaline  phosphatase 67, AST 24, ALT 18, albumin 3.6, CK 72, MB 2.1, troponin-I  0.67, calcium 8.7, TSH 5.922, free T4 1.29.   DISPOSITION:  The patient is being discharged home today in good  condition.   FOLLOW-UP PLANS AND APPOINTMENTS:  The patient is follow up with Dr.  Earl Lites  Ladona Ridgel on August 29th at  3:45 p.m.Marland Kitchen  She is asked to follow up  with primary care Avyn Coate for a Coumadin check on August 18.   DISCHARGE MEDICATIONS:  1. Aspirin 81 mg daily.  2. Plavix 75 mg daily.  3. Toprol XL 50 mg daily.  4. Lisinopril 10 mg daily.  5. Zocor 40 mg q.h.s.  6. Prozac 20 mg daily.  7. Lasix 40 mg daily.  8. Amiodarone 20 mg daily.  9. Coumadin as previously prescribed.  10.Nitroglycerin 0.4 mg sublingual p.r.n. chest pain.   OUTSTANDING LAB STUDIES:  None.   DURATION OF DISCHARGE ENCOUNTER:  Forty five minutes, including  physician time.      Nicolasa Ducking, ANP      Veverly Fells. Excell Seltzer, MD  Electronically Signed    CB/MEDQ  D:  05/06/2007  T:  05/06/2007  Job:  [id number]

## 2011-02-03 NOTE — Discharge Summary (Signed)
NAME:  [name], [name]               ACCOUNT NO.:  [id number]   MEDICAL RECORD NO.:  [id number]          PATIENT TYPE:  INP   LOCATION:  3029                         FACILITY:  [facility]   PHYSICIAN:  Pramod P. Pearlean Brownie, MD    DATE OF BIRTH:  1933/06/20   DATE OF ADMISSION:  05/11/2007  DATE OF DISCHARGE:  05/18/2007                               DISCHARGE SUMMARY   DIAGNOSES AT TIME OF DISCHARGE.:  1..  Bilateral PCA and left  frontoparietal cardioembolic infarct while off Coumadin after a  colonoscopy.  The patient with history of atrial fibrillation.  1. Atrial fibrillation.  2. Recent cardiac stent May 04, 2007  3. Dyslipidemia.  4. Depression.  5. Hypertension  6. Diastolic congestive heart failure  7. Coronary artery disease  8. Gastroesophageal reflux disease.   MEDICINES AT TIME OF DISCHARGE:  1. Amiodarone 200 mg a day  2. Prozac 20 mg a day  3. Plavix 75 mg a day  4. Zocor 40 mg a day  5. Lasix 40 mg b.i.d.  6. Aspirin 81 mg a day  7. Toprol XL 50 mg on hold  8. Lisinopril 10 mg a day  9. Nitroglycerin 0.4 mg p.r.n.  10.Nexium 40 mg a day  11.Coumadin 5 mg a day.   STUDIES PERFORMED:  1. CT of the brain on admission shows acute to subacute left occipital      infarct  2. MRI of the brain shows bilateral posterior cerebral artery      hemorrhagic infarct with some small amount of hemorrhagic      transformation.  There is a small acute infarct in the left frontal      parietal white matter also  3. MRA of the brain shows occlusion of the right posterior cerebral      artery  4. Follow-up CT of the head at 24 hours shows interval evolution of      right PCA, left parietal occipital sulcus and right thalamic      infarct.  Petechial hemorrhage involving the left parietal      occipital infarct without mass effect or extra-axial hemorrhage  5. Carotid Doppler shows no ICA stenosis.  Vertebral flow antegrade.  6. 2-D echocardiogram shows EF of 60% with no left  ventricular      regional wall motion abnormalities.  No embolic source,  7. EKG shows marked sinus bradycardia, cannot rule out anterior      infarct age undetermined.  8. Transcranial Doppler performed, results pending.   LABORATORY STUDIES:  CBC, hemoglobin 11.0, hematocrit 32.5, otherwise  normal.  Chemistry with potassium 3.4, creatinine 1.29 which is down  from 1.65 at admission, otherwise normal.  Coagulation studies with INR  2.0 day of discharge, with heparin level 0.34 and PT 23.2.  Liver  function tests normal, albumin 3.4.  Cardiac enzymes negative.  Cholesterol 96, HDL 40, LDL 46, triglycerides 51.  Urinalysis shows  small leukocyte esterase, rare bacteria, three to six white blood cells  0-2 red blood cells.  Homocystine is 14.5, hemoglobin A1c is 5.8 and sed  rate is 26.  HISTORY OF PRESENT ILLNESS:  [name] is a 77 year old right-  handed Caucasian female with a history of MI and coronary stenting  performed May 04, 2007 at [facility].  She was staying with  her sister here in [place].  She states she had been feeling unwell  all day.  She got up early in the morning and was nauseated and  vomiting.  She felt a little bit better later in the afternoon but at  some point in the afternoon she developed a headache.  The headache  progressed to a point where she complained about it her sister and about  10:45 p.m. she got out of her chair to go to her room, but wondered into  another room and seemed confused.  Her sister also noted she had some  slurred speech.  The sister became concerned and brought the patient to  the hospital.  At that time slurred speech resolved and she complained  only of a headache.  She was initially worked up for temporal arteritis  and her visual fields were not examined.  She then had a CT of the head  which read out as showing only a subacute stroke in the left parietal  occipital area and Dr. Thad Ranger was asked to  admit the patient.  Per Dr.  Thad Ranger she definitely had a right sided hemianopsia particularly  involving the left upper quadrant but by that time she was out of the  window for t-PA so it was not given.  She had no other focal deficits.  She had received morphine and Compazine for headache and that was  improving.  She denied any associated palpitations or chest pain.  She  has no known history of stroke.  She has a history of atrial  fibrillation which she has had several EP studies and follow with Dr.  Ladona Ridgel for this and is on amiodarone.  She states she has been  anticoagulated in the past and her records indicate she was to leave the  hospital on therapeutic Coumadin.  However, she states she is not taking  Coumadin since she had a severe nosebleed several months ago and it was  never resumed following a colonoscopy.  She was admitted to the hospital  for further stroke workup.   HOSPITAL COURSE:  MRI did reveal multiple acute infarcts, mostly in the  PCA territory but also a small left frontal parietal infarct.  Infarcts  were felt to be secondary to atrial fibrillation off Coumadin.  The  patient was placed on IV heparin with transfer to Coumadin in the  hospital.  She was evaluated by PT and OT and felt not to need inpatient  rehab.  Home health PT, OT and speech therapy have been arranged.  She  will continue on aspirin and Plavix secondary to recent stent.  She has  a high risk of bleeding and she and her family is aware.   CONDITION ON DISCHARGE:  The patient alert, oriented to person, place  and time.  No obvious aphasia with some questionable complex cognitive  deficits.  She has no dysarthria.  Her extraocular movements are intact.  She has a decreased visual field in the upper quadrant and laterally  bilaterally.  She has no focal extremity weakness.   DISCHARGE/PLAN:  1. Discharge home with sister  2. Coumadin for secondary stroke prevention and atrial  fibrillation.  3. Aspirin and Plavix secondary to recent stent,  duration to be  determined by cardiologist.   FOLLOW UP:  1. Dr. Ladona Ridgel with INR check and Coumadin management on Friday August      29.  2. Follow up with primary care physician in [place] within 1 month  3. Follow up with Dr. Delia Heady in his office in 2-3 months.   The patient has been advised not to drive.      Annie Main, N.P.    ______________________________  Sunny Schlein. Pearlean Brownie, MD    SB/MEDQ  D:  05/18/2007  T:  05/18/2007  Job:  [id number]   cc:   [name]. Ladona Ridgel, MD  [facility]  Dr. Molly Maduro Prior in [place]

## 2011-02-03 NOTE — Assessment & Plan Note (Signed)
[facility]                         ELECTROPHYSIOLOGY OFFICE NOTE   [name], [name]                  MRN:          [id number]  DATE:09/05/2007                            DOB:          1932-11-29    [name] returns today for followup.  She is a very pleasant 77-year-  old woman with a history of coronary disease, status post MI, history of  atrial flutter (left atrial), history of hypertension, and a history of  a stroke around the time of her angioplasty and stent of the LAD back in  August 2008.  She returns today for followup.   Overall, she is getting used to her visual problems and has  accommodated.  She denies chest pain.  She denies palpitations.  She has  had no shortness of breath to speak of.  She does admit to some  elevation in her blood pressure which was initially 180/100 today.  On  recheck, the systolic pressure was in the 150/90 range.  Otherwise, she  has no specific complaints except for some mild dizziness which we think  is related to residual from her stroke.   PHYSICAL EXAMINATION:  GENERAL:  She is a pleasant 77 year old woman in  no acute distress.  VITAL SIGNS:  The blood pressure today was previously noted.  The  respirations were 18.  The pulse was 60 beats a minute.  NECK:  Revealed no jugular venous distention.  LUNGS:  Clear bilaterally auscultation.  No wheezes, rales or rhonchi  are present.  EXTREMITIES:  Demonstrate no edema.   MEDICATIONS:  1. Aspirin 81 mg daily.  2. Nexium 40 a day.  3. Coumadin as directed.  4. Lisinopril 10 mg daily.  5. Furosemide 40 twice a day.  6. Amiodarone 200 mg daily.  7. Simvastatin 40 a day.  8. Fluoxetine 20 mg daily.   IMPRESSION:  1. Atrial flutter.  2. Atrial fibrillation.  3. History of stroke.  4. Chronic Coumadin therapy.  5. Chronic amiodarone therapy.  6. Dyslipidemia on Statin.  7. Glaucoma.  8. Status post stroke.   DISCUSSION:  Today, we are  going to decrease [name] Simvastatin to  20 mg daily because of the potential interaction with amiodarone.  I  will see her back in the office in 4-5 months.     [name]. Ladona Ridgel, MD  Electronically Signed    GWT/MedQ  DD: 09/05/2007  DT: 09/05/2007  Job #: [id number]

## 2011-02-03 NOTE — Discharge Summary (Signed)
[name], [name]               ACCOUNT NO.:  [id number]   MEDICAL RECORD NO.:  [id number]          PATIENT TYPE:  INP   LOCATION:  3739                         FACILITY:  [facility]   PHYSICIAN:  [name]. Ladona Ridgel, MD    DATE OF BIRTH:  05/19/1933   DATE OF ADMISSION:  09/09/2007  DATE OF DISCHARGE:  09/15/2007                         DISCHARGE SUMMARY - REFERRING   DISCHARGE DIAGNOSES:  1. Unstable angina.  2. Coronary artery disease.  3. Abdominal aortic asymptomatic dissection.  4. Paroxysmal atrial fibrillation with anticoagulation history.  5. Hypokalemia.  6. Hyperlipidemia.  7. Prolonged QTc with decrease of amiodarone, history as previously.  8. Cardiac catheterization performed by Dr. Gala Romney on September 12, 2007.   BRIEF HISTORY:  [name] is a 77 year old female who, after eating  lunch today on the day of admission at [facility]', had gotten up to use  the bathroom; however, on the way out of the bathroom, she had a  presyncopal episode where she described it as everything going gray and  she became very wobbly.  This was followed by substernal chest  discomfort which she described as pressure radiating up into her neck  with a choking feeling and radiating to the left arm.  She was assisted  to a chair and she received one sublingual nitroglycerin without relief.  After a second sublingual nitroglycerin, her symptoms somewhat improved.  She was taken emergently to the emergency room by her sister.  An EKG  did not show any specific findings.  However, a QTc was prolonged at  615.   PAST MEDICAL HISTORY:  Notable for coronary artery disease and  catheterization in August 2008.  She received a bare metal stenting.  However, this was complicated by a distal apical LAD occlusion suspected  from embolization.  Past medical history is also notable for amoxicillin  allergy, atrial fib/flutter unresponsive to cardioversion, sinus node  dysfunction for which she is  on Coumadin and amiodarone, diastolic CHF,  hypertension, dyslipidemia, TIA, GERD and depression.   LABORATORY:  ANA was negative.  Fasting lipids showed a total  cholesterol of 142, triglycerides 55, HDL 51, LDL 80.  CK-MBs, relative  indexes and troponins were within normal limits x3.  Admission sodium  was 142, potassium 3.3, BUN 18, creatinine 1.38, glucose 118.  The PTT  was 23.1, INR 2.0, PTT 41.  ESR 25.  Admission H&H were 12.1 and 37.2,  normal indices, platelets 291, WBCs 7.6.  The peripheral arterial study  was unremarkable.   HOSPITAL COURSE:  The patient was admitted by Joni Reining and Dr.  Lewayne Bunting to [facility].  Her amiodarone was decreased given  her prolonged QTc.  Potassium supplementation was issued.  By September 10, 2007, she felt much better and she had ruled out for myocardial  infarction.  Given her symptoms, it was felt that she should undergo  cardiac catheterization.  This was performed on September 12, 2007 and  showed a 30% proximal RCA and 30% mid ramus.  There was hazy possible 70-  80% lesion seen only in  the lateral views of the LAD with a distal 50-  60% LAD lesion in the prior stent.  EF was 50-55% with basilar anterior  hypokinesis.   Dr. Gala Romney was concerned about the potential of an abdominal aortic  aneurysm given the false lumen.  Dr. Gala Romney ordered a CT angiogram to  evaluate possible dissection and a vascular surgery consult with Dr.  Darrick Penna.  Dr. Darrick Penna felt that she did have a dissection, but she is  asymptomatic and no evidence of end-organ compromised and recommended  repeat CT angio of the abdomen and pelvis in 6 months to make sure that  there has not been any change and to obtain bilateral ABIs.   Progression nurses assisted with discharge needs.  Over the next couple  of days, Dr. Ladona Ridgel adjusted her medications.  After reviewing further  notes, it was felt that further catheterization should be avoided and   she should have an outpatient stress test to further evaluate her  coronary artery disease.  By the September 15, 2007, Dr. Ladona Ridgel felt that  the patient could be discharged home.   DISPOSITION:  The patient was discharged home by Dr. Lewayne Bunting.  Her  amiodarone was decreased to 100 mg daily.  She received new  prescriptions for Imdur 30 mg daily, K-Dur 20 mEq daily, Lovenox 80 mg  every 12 hours until INR therapeutic.  She will have a PT/INR with Dr.  Adele Barthel on Friday.  Her adenosine Myoview was scheduled for September 27, 2007 a 12:30.  She was asked to continue her other medications which  include aspirin 81 mg daily, Coumadin 2.5 mg daily except 5 mg Monday,  Wednesday and Friday, lisinopril 10 mg daily, Zocor 10 mg nightly,  Prozac 20 mg daily, Lasix 40 mg daily and Toprol XL 25 mg daily.  She  was asked to bring all medications to all follow-up appointments.  She  will followup with Dr. Ladona Ridgel on October 17, 2007 at 11:45.      Joellyn Rued, PA-C      [name]. Ladona Ridgel, MD  Electronically Signed    EW/MEDQ  D:  10/19/2007  T:  10/19/2007  Job:  [id number]   cc:   Adele Barthel, MD  [name]. Ladona Ridgel, MD

## 2011-02-03 NOTE — Assessment & Plan Note (Signed)
[facility]                         ELECTROPHYSIOLOGY OFFICE NOTE   [name], [name]                  MRN:          [id number]  DATE:12/06/2008                            DOB:          [birth year]    [name] returns today for followup.  She is a very pleasant woman  with multiple medical problems including;  1. Cerebrovascular disease status post stroke.  2. Paroxysmal atrial now permanent AFib with prior atrial flutter.  3. Coronary artery disease status post MI.  4. Hypertension.  5. Dyslipidemia.   [name] returns today for followup.  Overall, she is improved.  She  still has problems with vision and with balance and gait, though these  are better.  She states she is without a walker, though I have  encouraged her not to be.  She denies chest pain.  She denies shortness  of breath.  She denies peripheral edema.  She continues to live in  [place], [place] and comes down to be with her sister at times.  She has  a full-time help and her brother and sister looking on at other times.   CURRENT MEDICATIONS:  1. Aspirin daily.  2. Nexium 40 a day.  3. Coumadin as directed.  4. Lisinopril 10 a day.  5. Metoprolol 25 twice a day.  6. Xalatan eye drops.  7. Simvastatin 10 a day.  8. Fluoxetine daily.  9. Furosemide 20 a day.  10.Digoxin 0.125 daily.   PHYSICAL EXAMINATION:  GENERAL:  She is a pleasant elderly woman in no  distress.  VITAL SIGNS:  Blood pressure today was 138/96, the pulse was 61 and  regular, respirations were 18, weight was 170 pounds.  NECK:  No jugular venous distention.  LUNGS:  Clear bilaterally to auscultation.  No wheezes, rales, or  rhonchi are present.  No increased work of breathing.  CARDIOVASCULAR:  Irregular regular rhythm with normal S1 and S2.  ABDOMEN:  Soft, nontender.  EXTREMITIES:  No peripheral edema.  There is no cyanosis or clubbing.   EKG demonstrates atrial fibrillation with a controlled  ventricular  response and rare PVCs.  There was nonspecific ST-T-wave abnormality.   IMPRESSION:  1. Now chronic atrial fibrillation.  2. Hypertension.  3. Coronary artery disease status post myocardial infarction.  4. Stroke.  5. Peripheral vascular disease well controlled and asymptomatic.   DISCUSSION:  [name] is stable.  Her AFib is well controlled at the  present time and she is improved with regard to her balance and gait.  I  have cautioned her that her biggest risk in the coming months and years  is from falls.  Because her CHADS score is quite high, she will continue  on Coumadin despite her fall risk.  I will see her back in several  months.     [name]. Ladona Ridgel, MD  Electronically Signed    GWT/MedQ  DD: 12/06/2008  DT: 12/06/2008  Job #: [id number]

## 2011-02-03 NOTE — Assessment & Plan Note (Signed)
[facility]                                 ON-CALL NOTE   KARSON, CHICAS                        MRN:          [id number]  DATE:09/16/2007                            DOB:          01-03-1933    PRIMARY CARDIOLOGIST:  Dr. Lewayne Bunting.   I received a call from Ms. Isaac' sister stating that she did not have  prescriptions for medications and had been discharged home this week.  Ms. Donate was here and had a cardiac catheterization on September 12, 2007.  Note, discharge summary is not available in the computer system  yet, but Ms. Gressman apparently was told to take KCl 20 mEq daily and  Imdur 30 mg daily.  Her sister states that these prescriptions are  written on the pink sheet, but no prescriptions were given.  I called in  a prescription to [facility] at [facility], [phone] for these prescriptions.      Dorian Pod, ACNP  Electronically Signed      Rollene Rotunda, MD, [facility]  Electronically Signed   MB/MedQ  DD: 09/16/2007  DT: 09/16/2007  Job #: [id number]

## 2011-02-03 NOTE — Assessment & Plan Note (Signed)
[facility]                         ELECTROPHYSIOLOGY OFFICE NOTE   [name], [name]                  MRN:          [id number]  DATE:03/29/2008                            DOB:          [birth year]    HISTORY OF PRESENT ILLNESS:  [name] returns today for followup.  She is a very pleasant, elderly woman with a multitude of medical  problems including ischemic heart disease status post MI, history of  contained aortic dissection, history of stroke, history of dyslipidemia,  and a history of atrial arrhythmias.  The patient has returned to [place],  [place], where she is from, and has had a sitter helping her with her  activities of daily living.  She has been stable.  The patient's main  complaint today is that she has had a couple of episodes of dizziness  and lightheadedness, and one episode where she actually passed out.  Prior to each of these, the blood pressure was low in the 80s.  She  returns today for physician evaluation.  She denies any frank chest  pain.  She denies dyspnea with exertion.   MEDICATIONS:  1. Aspirin 81 a day.  2. Nexium 40 a day.  3. Coumadin as directed.  4. Lisinopril 10 mg daily.  5. Imdur 30 a day.  6. Furosemide 20 mg daily.  7. Potassium 20 mEq daily.  8. Metoprolol 25 a day.  9. Simvastatin 10 a day.  10.Amiodarone 100 daily.   PHYSICAL EXAMINATION:  GENERAL:  She is a pleasant, elderly-appearing  woman, in no acute distress.  VITAL SIGNS:  Blood pressure today was 121/67, the pulse was 50 and  regular, and the respirations were 18.  NECK:  No jugular venous distention.  LUNGS:  Clear bilaterally to auscultation.  No wheezes, rales, or  rhonchi are present.  CARDIOVASCULAR:  Regular rate and rhythm.  Normal S1 and S2.  ABDOMINAL:  Soft, nontender.  EXTREMITIES:  No edema.   IMPRESSION:  1. Ischemic heart disease status post myocardial infarction.  2. History of stroke.  3. Contained aortic  dissection.  4. Hypertension.  5. Recurrent atrial arrhythmias, now well controlled on low-dose      amiodarone.   DISCUSSION:  Overall, [name] is stable despite her multitude of  medical problems.  I have asked that she discontinue her Imdur today and  use sublingual nitroglycerin if she has  recurrent anginal symptoms.  Her dose of Lasix has also been decreased.  I will plan to see her back in several months.     [name]. Ladona Ridgel, MD  Electronically Signed    GWT/MedQ  DD: 03/29/2008  DT: 03/30/2008  Job #: [id number]   cc:   Adele Barthel, MD

## 2011-02-03 NOTE — Assessment & Plan Note (Signed)
[facility]                         ELECTROPHYSIOLOGY OFFICE NOTE   [name], [name]                  MRN:          [id number]  DATE:12/16/2007                            DOB:          10/23/1932    HISTORY:  [name] returned today for followup.  She is a very  pleasant 77 year old woman who has a history of coronary disease status  post stenting.  She has a history of paroxysmal  AFib who is on Coumadin.  She has a history of abdominal aortic aneurysm  with spontaneous dissection which healed on its own.  She has a history  of stroke in the past who returns today for followup.  She had recently  complained of falling while she was coming out of a restaurant and had  difficulty getting up.  Previously she had seen physical therapist, but  she no longer does so.  Otherwise, she had no specific complaints today  except for generalized weakness.  Her sister who is with her today  states that she has not been particularly enthusiastic about exercising.   MEDICATIONS:  1. Aspirin 81 mg daily.  2. Nexium 40 a day.  3. Coumadin as directed.  4. Lisinopril 10 a day.  5. Imdur 30 a day.  6. Furosemide 40 a day.  7. Metoprolol 25 daily.  8. Multiple eye drops.   PHYSICAL EXAMINATION:  GENERAL:  She is a pleasant 77 year old woman in  no acute distress.  VITAL SIGNS:  Blood pressure was 119/72, pulse 57 and regular,  respirations were 18, weight was 176 pounds.  NECK:  Revealed no jugular venous distention.  LUNGS:  Clear bilaterally to auscultation.  No wheezes, rales or rhonchi  are present.  CARDIOVASCULAR:  Regular rate and rhythm with normal S1-  S2.  EXTREMITIES:  Demonstrate no edema.  NEUROLOGIC:  Her gait was tested as well as was the strength.  She has  decreased strength bilaterally in her lower extremities I gauge at 4/5.  Walking, she is somewhat unsteady with gait and she actually needs to  hold onto me when she walks in the  hall.   DIAGNOSTICS:  EKG demonstrates sinus rhythm with sinus bradycardia and  rare PACs.   IMPRESSION:  1. Known coronary disease status post myocardial infarction, now      quiescent.  2. History of stroke.  3. Paroxysmal atrial fibrillation.  4. Chronic Coumadin therapy.  5. Spontaneous aortic dissection (healed).  6. Weakness and gait problems.  7. Chronic nausea.   DISCUSSION:  [name] has a host of medical problems and has a  difficult situation.  Her main limitation today is that of nausea as  well as problems with weakness and I am concerned about Coumadin therapy  for this patient because of her propensity to fall.  However, she has  clearly had at least 2 strokes which are almost certainly related to  thromboembolism.  For this reason, I think stopping her Coumadin will be  more dangerous than continuing it at least for now.  I do not know what  the mechanism of her nausea and vomiting is, but  the patient states that  she has had this problem off and on for years, though it has gotten  worse lately.  Finally, I suggested and strongly advised her to start  walking on a regular basis typically 4-5 times per week.  She has been  very inactive, she is quite weak and I think this is contributing to her  propensity to fall.  To this end, I have asked her to go to  the  exercise area at the [facility] with her sister and walk as much as  possible.   FOLLOW UP:  I will plan to see her back in 4 months, sooner should she  have worsening problems.     [name]. Ladona Ridgel, MD  Electronically Signed    GWT/MedQ  DD: 12/16/2007  DT: 12/17/2007  Job #: [id number]

## 2011-02-03 NOTE — Letter (Signed)
May 30, 2007    Richarda Overlie, M.D.  [address] [place], [place] [address]   RE:  KANETRA, HO  MRN:  [id number]  /  DOB:  [birth year]   Dear Huntley Dec:   As we discussed several days ago, this is a letter of introduction on a  patient named Lamisha Roussell.  Date of birth [birth year].  Ms.  Shedlock is a very pleasant 77 year old woman with a history of coronary  disease, hypertension, and left atrial flutter.  She was hospitalized  just over a month ago with an acute non-Q wave myocardial infarction and  was found to have dissection of her distal left anterior descending  coronary artery for which she underwent angioplasty and stenting.  Unfortunately, the patient following her procedure and after discharge  to home, noted visual changes and her sister who she was living with at  the time had noted slurring of her speech and she was subsequently  readmitted and CT scanning demonstrated evidence of multiple strokes  involving particularly the posterior circulation.   I saw the patient back in the office several days ago and her biggest  complaint was that of difficulty with her vision and processing as we  discussed.  The patient has really been quite limited by her visual  problems.  As we discussed several days ago, I am not certain whether  there might be anything that could be done to help her with this but  would certainly appreciate  your thoughts and input and appreciate you or one of your partners being  willing to see this very nice patient.   Do not hesitate to contact me for any other issues.  In this letter of  introduction, we will note that the patient's multiple dictated notes  and neurology reports.    Sincerely,      [name]. Ladona Ridgel, MD  Electronically Signed    GWT/MedQ  DD: 05/30/2007  DT: 05/30/2007  Job #: [id number]

## 2011-02-03 NOTE — Cardiovascular Report (Signed)
NAMEJASANI, LENGEL               ACCOUNT NO.:  [id number]   MEDICAL RECORD NO.:  [id number]          PATIENT TYPE:  INP   LOCATION:  6522                         FACILITY:  [facility]   PHYSICIAN:  Veverly Fells. Excell Seltzer, MD  DATE OF BIRTH:  [birth year]   DATE OF PROCEDURE:  05/04/2007  DATE OF DISCHARGE:                            CARDIAC CATHETERIZATION   PROCEDURE PERFORMED:  Left heart catheterization, selective coronary  angiography, left ventricular angiography, PTCA and stenting of distal  LAD, and Angio-Seal of the right femoral artery.   INDICATIONS:  The patient is a 77 year old woman who presented with ST  elevation myocardial infarction.  She had no past history of coronary  artery disease.  She had chest pain at presentation.  In the setting of  her subendocardial myocardial infarction she was referred for diagnostic  catheterization.   Risks and indications of the procedure were explained to the patient.  Informed consent was obtained.   DESCRIPTION OF PROCEDURE:  The right groin was prepped, draped, and  anesthetized with 1% lidocaine using modified Seldinger technique.  A 6-  French sheath was placed in the right femoral artery.  Multiple views of  the left and right coronary arteries were taken.  Following coronary  angiography an angled pigtail catheter was inserted into the left  ventricle and pressures were recorded.  A left ventriculogram was  performed and a pullback across the aortic valve was done.   At the conclusion of the diagnostic procedure I elected to intervene on  the LAD.  The LAD was tortuous with a long segment of what appeared to  be spontaneous dissection in the distal LAD.  There was associated  thrombus and severe stenosis of 90%.  The patient had received Lovenox.  She was given an additional intravenous bolus of 0.3 mg/kg.  Integrilin  was used in addition.  She was given clopidogrel immediately at the  conclusion of the procedure.  An XB-35 guide  catheter was used.  A  cougar guidewire was passed with a moderate amount of difficulty into  the apical portion of the LAD beyond the area of severe stenosis and  dissection.  A 1.5 x 20 mm balloon was passed and inflated to 10  atmospheres.   Following balloon dilatation a 20 x 23 mm mini vision stent was deployed  a 12 atmospheres.  The stent was well expanded.  Following stenting  there was occlusion of the very apical portion of the LAD.  The patient  had minor chest pain that resolved within five minutes.  We gave  intracoronary nitroglycerin and then performed two  more angiographic  views.  There was some flow out to the apex but the very distal portion  of the apical LAD was occluded, I suspect secondary to either  embolization or intramural hematoma as part of her primary process of  spontaneous dissection.  I elected to discontinue the procedure at that  point.  An Angio-Seal device was deployed to seal the femoral  arteriotomy.  All catheter exchanges were performed over a guidewire.  There were no immediate complications.  FINDINGS:  Aortic pressure 103/58 with a mean of 76, left ventricular  pressure 102/6.   The left mainstem has minor luminal irregularities but no significant  stenoses.  It trifurcates into the LAD intermediate branch and left  circumflex.   The LAD is a large caliber vessel that courses down and wraps around the  LV apex.  The proximal portion of the vessel has an area of 20% stenosis  just after the first septal perforator.  The vessel has no significant  disease in its mid portion.  The distal portion of the vessel has what  appears to be a spontaneous dissection with a filling defect over a long  segment of the vessel.  There is suspicion of thrombus as well.  There  is TIMI-III flow out to the apex.   The intermediate branch is a large vessel.  It courses down and  bifurcates into twin vessels.  There is no significant angiographic   disease.   The left circumflex is medium caliber.  It does not give off any  substantial branches.  There is no significant angiographic disease.   The right coronary artery was difficult to engage.  Nonselective imaging  was performed.  There is plaque in the proximal portion of the right  coronary artery and no other significant angiographic disease.  Distally  it provides a long posterolateral branch as well as a PDA branch.   The left ventricular function is normal.  The left ventricular ejection  fraction is 65%.  There is no mitral regurgitation.   ASSESSMENT:  1. Spontaneous LAD dissection with associated severe stenosis in the      distal aspect of the LAD, successful PCI with a 2.0 x 23 mm mini      vision bare metal stent.  As noted above, there was apical LAD      occlusion following stenting and I elected not to treat this      mechanically due to its distal location in the vessel.  2. Non-obstructive left circumflex and right coronary artery stenosis.  3. Normal left ventricular systolic function.   RECOMMENDATIONS:  Recommend lifelong aspirin and 30 days of clopidogrel.  The patient may need Coumadin in the future with her history of atrial  flutter.  The vessel was not big enough for a drug-eluting stent so a  bare metal stent was placed.   She should receive routine post myocardial infarction medical therapy.      Veverly Fells. Excell Seltzer, MD  Electronically Signed     MDC/MEDQ  D:  05/04/2007  T:  05/05/2007  Job:  [id number]   cc:   Veverly Fells. Excell Seltzer, MD

## 2011-02-03 NOTE — Discharge Summary (Signed)
NAMEANDREIKA, VANDAGRIFF               ACCOUNT NO.:  [id number]   MEDICAL RECORD NO.:  [id number]          PATIENT TYPE:  INP   LOCATION:  6522                         FACILITY:  [facility]   PHYSICIAN:  Veverly Fells. Excell Seltzer, MD  DATE OF BIRTH:  10-26-1932   DATE OF ADMISSION:  05/03/2007  DATE OF DISCHARGE:  05/06/2007                               DISCHARGE SUMMARY   ADDENDUM:  Previous number is [id number].   Please note that the patient previously had epistaxis and anemia while  on Coumadin therapy, and therefore we will not resume Coumadin therapy  at the time of discharge.  Please remove Coumadin from the discharge  medication list, and please change the aspirin dosage to 325 mg daily.      Nicolasa Ducking, ANP      Veverly Fells. Excell Seltzer, MD  Electronically Signed    CB/MEDQ  D:  05/06/2007  T:  05/06/2007  Job:  [id number]

## 2011-02-03 NOTE — Assessment & Plan Note (Signed)
OFFICE VISIT   [name], [name]  DOB:  [birth year]                                       07/10/2010  ZOXWR#:[id number]   The patient returns for followup today for a chronic aortic dissection.  She was last seen in October of 2010.  At that time she was having  nausea and weight loss.  She states that these symptoms have since  resolved.  She apparently was in the hospital in [place] a few weeks ago  for rule out myocardial infarction but it feels better today.   On review of systems today she currently denies any shortness of breath  or chest pain.   She continues to deny any abdominal or back pain at this point.  She has  no lower extremity claudication symptoms.   Past medical history is significant for atrial fibrillation,  hypertension, hyperlipidemia, prior TIA, reflux and mild depression.   PHYSICAL EXAMINATION:  Vital signs:  Blood pressure is 115/71 in the  left arm, heart rate 67 and regular, respirations 24.  HEENT:  Unremarkable.  Neck:  Has 2+ carotid pulses without bruit.  Chest:  Clear to auscultation.  Cardiac:  Exam is regular rate and rhythm.  Abdomen:  Soft, nontender, nondistended.  No pulsatile mass.  Extremities:  She has 2+ radial, 2+ femoral, 2+ left dorsalis pedis and  1+ posterior tibial pulse in the right foot, 2+ dorsalis pedis pulse in  the left foot, 1+ posterior tibial pulse in the right foot.  She has no  significant lower extremity edema.   She had a CT angiogram of the chest, abdomen and pelvis today which I  reviewed.  This shows that the aorta is still 3.2 cm in diameter.  The  dissection starts in the descending thoracic aorta and proceeds all the  way down through the abdomen down into the right common iliac artery.  There is no flow compromise of the celiac or SMA or renal arteries.   The patient's dissection is chronic in nature and has now been stable in  appearance for approximately 2-1/2 years.  At this point I  believe we  can space her followup out a little bit.  We will see her again with a  repeat CTA in 2 years' time.  She will return sooner if she has any  difficulties before then.     Janetta Hora. Fields, MD  Electronically Signed   CEF/MEDQ  [name]:  07/10/2010  T:  07/11/2010  Job:  [id number]   cc:   [name]. Ladona Ridgel, MD  Dr Jamelle Haring

## 2011-02-06 NOTE — Assessment & Plan Note (Signed)
[facility]                              CARDIOLOGY OFFICE NOTE   [name], Robin                        MRN:          [id number]  DATE:04/08/2006                            DOB:          Apr 02, 1933    HISTORY OF PRESENT ILLNESS:  [name] returns today for followup.  She  has had diastolic heart failure.  Her heart cath showed no coronary artery  disease.  She has good LV function.   She has had atrial fibrillation/flutter, looking back at her records.  There  is an EKG from March 31, 2006, which shows fairly coarse flutter.  Her EKG  today shows fairly classic flutter.  I do not have any documentation of just  atrial fibrillation.  I am concerned that she may be a candidate for a  flutter ablation and has not been evaluated for this yet.   Her rate controls is fine.  She feels better.  She had some nausea about a  week ago which may have been related to hypokalemia.   This seems to have improved.  She still is living with her sister here in  [place] before she returns to [place].  Home health has been checking her  INR's.  They have been subtherapeutic.  I have told her that at least while  she is here in [place] I would like her to see our Coumadin Clinic for  better followup.  Apparently, even though her INR's have been low, her  dosages have not been increased.   PHYSICAL EXAMINATION:  GENERAL:  She looks wells.  VITAL SIGNS:  She is in atrial flutter.  Her rate is 77.  LUNGS:  Clear.  NECK:  Carotids are normal.  HEART:  There is a S1 and S2 with normal heart sounds.  ABDOMEN:  Benign.  EXTREMITIES:  Lower extremity pulses intact.  No edema.   MEDICATIONS:  1.  KCL 20 mEq daily.  2.  Prozac 20 mg daily.  3.  Warfarin, I am not sure of her current dose of Warfarin.  4.  Cartia 180 mg daily.  5.  Atenolol 25 mg daily.  6.  Simvastatin 40 mg daily.  7.  Fosamax 10 mg daily.  8.  Lasix 20 mg daily.   IMPRESSION:  1.  Stable  congestive heart failure, diastolic, good left ventricular      function, no coronary disease.  2.  Atrial flutter.   PLAN:  Follow blood pressure in the next week or two for possible candidacy  for flutter ablation, 24 hour Holter monitor to rule out fibrillation.  Continue current rate control.  Follow up in the Coumadin Clinic in regards  to getting a more optimal Coumadin dose.  I also think she would be better  tolerated with 20 mg of Lasix and 10 mEq of KCL.                               Noralyn Pick. Eden Emms, MD, [facility]    PCN/MedQ  DD:  04/08/2006  DT:  04/08/2006  Job #:  [id number]

## 2011-02-06 NOTE — Assessment & Plan Note (Signed)
[facility]                         ELECTROPHYSIOLOGY OFFICE NOTE   KAMEKA, WHAN                  MRN:          [id number]  DATE:12/23/2006                            DOB:          Aug 10, 1933    Ms. Daus returns today for followup. She is a very pleasant woman  with a history of palpitations and left atrial flutter who we have been  maintaining in sinus rhythm fairly nicely on amiodarone therapy. I saw  her most recently in December and in the interim she has had one  hospitalization in [place], [place] which I do not have records of where  apparently she went back into atrial flutter and was treated with beta  blockade in conjunction with her amiodarone and sinus rhythm was  restored. The patient returns today for followup and overall has been  stable. She does have dyspnea with exertion and chest discomfort with  severe exertion. She denies syncope.   PHYSICAL EXAMINATION:  GENERAL:  She is a pleasant woman in no distress.  VITAL SIGNS:  Blood pressure was 131/83, the pulse was 75 and regular.  The respirations were 18. The weight was 191 pounds.  NECK:  Revealed no jugular venous distention. There was no thyromegaly,  trachea was midline.  LUNGS:  Clear bilaterally to auscultation.  CARDIOVASCULAR:  Regular rate and rhythm with normal S1 and S2.  EXTREMITIES:  Demonstrated no edema.   EKG demonstrates sinus rhythm with what looks like a wandering atrial  pacemaker and variable PR intervals. We had the patient ambulate in our  hallway with upright posture, her heart rate increased to 87 and walking  increased up to 96 beats per minute with no significant change in oxygen  saturation (97-95).   IMPRESSION:  1. Atrial flutter (left atrial).  2. Amiodarone therapy.  3. Chronic Coumadin therapy.   DISCUSSION:  Overall Ms. Issa is stable. Her amiodarone is holding  her in sinus rhythm fairly nicely. Will continue on her beta  blocker.  She will continue on her diuretics and blood thinners and I will plan to  see her back in the office in 4 months.     [name]. Ladona Ridgel, MD  Electronically Signed    GWT/MedQ  DD: 12/23/2006  DT: 12/23/2006  Job #: [id number]   cc:   Adele Barthel, MD

## 2011-02-06 NOTE — Assessment & Plan Note (Signed)
[facility]                           ELECTROPHYSIOLOGY OFFICE NOTE   [name], [name]                        MRN:          [id number]  DATE:04/14/2006                            DOB:          June 07, 1933    [name] was referred today by Noralyn Pick. Eden Emms, MD, for evaluation of  atrial flutter.   HISTORY OF PRESENT ILLNESS:  The patient is a very pleasant woman from  [place], [place], who was visiting her sister when she developed sudden onset  of shortness of breath and was found to be in atrial flutter with a rapid  ventricular response.  She was admitted to the hospital, where her cardiac  enzymes were borderline and BNP was elevated, and she underwent  catheterization demonstrating no obstructive coronary disease.  The patient  ultimately was discharged home with a rate control strategy.  She states  that remotely she had TIAs and presented to the [facility] for these  several months ago.  Her symptoms fortunately resolved and she was placed on  Coumadin for this.  She denies a history of syncope or peripheral edema.   Her additional past medical history is notable for a history of multiple  knee surgeries in the past including a knee replacement.  She has a history  of significant weight gain in the past.  She has a history of dyslipidemia  and a history of chronic depression after her teenage son was killed in an  automobile accident many years ago.   SOCIAL HISTORY:  She is widowed.  She denies tobacco or ethanol abuse.   FAMILY HISTORY:  Notable for her mother, who died of colon cancer, and her  father died of complications of a stroke.  She had a sister with aortic  valve replacement.   REVIEW OF SYSTEMS:  As noted in the HPI.  Otherwise, all systems reviewed  and found to be negative.   PHYSICAL EXAMINATION:  GENERAL:  She is a pleasant, well-appearing woman in  no acute distress.  VITAL SIGNS:  The blood pressure was  100/60, the pulse 55 and irregular,  respirations are 18.  The weight was 194 pounds.  HEENT:  Normocephalic and atraumatic.  Pupils equal and round.  The  oropharynx was moist.  The sclerae were anicteric.  NECK:  No jugular venous distention.  There was no thyromegaly.  The trachea  was midline.  The carotids were 2+ and symmetric.  LUNGS:  Clear bilaterally to auscultation with no wheezes, rales or rhonchi.  There was no increased work of breathing.  CARDIOVASCULAR:  Regular rate and rhythm with normal S1 and S2.  No murmurs,  rubs or gallops present or appreciated.  The PMI was not laterally displaced  nor was it enlarged.  ABDOMEN:  Soft, nontender, nondistended.  There was no organomegaly.  Bowel  sounds were present and there was no rebound or guarding.  EXTREMITIES:  No clubbing, cyanosis, or edema.  The pulses were 2+ and  symmetric.  NEUROLOGIC:  Alert and oriented x3 with cranial nerves intact.  The strength  was 5/5 and  symmetric.   EKG demonstrates atypical flutter with a controlled ventricular response.  The flutter waves appeared to be positive in the inferior leads and negative  in V1, suggestive of either a left atrial flutter or perhaps, more likely,  clockwise reverse typical atrial flutter utilizing the tricuspid valve  annulus.   IMPRESSION:  I have discussed treatment options with the patient and her  sister in detail.  The patient has not been therapeutic on Coumadin for 3  weeks.  I have recommended proceeding with catheter ablation of her atrial  flutter once we get her therapeutic with her Coumadin.  The risks, benefits,  goals and expectations of the procedure have been discussed with the  patient, and she would like to proceed.  This will be scheduled once she has  been therapeutic with her Coumadin.                                   [name]. Ladona Ridgel, MD   GWT/MedQ  DD:  04/14/2006  DT:  04/14/2006  Job #:  [id number]   cc:   Noralyn Pick. Eden Emms, MD, [facility]

## 2011-02-06 NOTE — Assessment & Plan Note (Signed)
[facility]                         ELECTROPHYSIOLOGY OFFICE NOTE   [name], FULL                  MRN:          [id number]  DATE:09/06/2006                            DOB:          Feb 04, 1933    [name] returns today for followup. She is a very pleasant middle-  age woman with a history of palpitations and documented atrial flutter  who underwent EP study several months ago and was found to have a left  atrial flutter. She has been treated with amiodarone with nice result  since then. She returns today for followup. Since I saw her last several  months ago, she has had one if not two episodes of palpitations, each  one lasting for less than an hour. The most recent one occurred last  Saturday and occurred while she was out shopping. She had palpitations  and some associated chest pressure. This all lasted for about one hour  and resolved spontaneously. She has had no syncope and she denies  peripheral edema. Otherwise she has been stable.   MEDICATIONS:  Potassium, fluoxetine, warfarin, simvastatin, Fosamax,  furosemide, Protonix, amiodarone 200 a day.   PHYSICAL EXAMINATION:  GENERAL:  Notable for a pleasant, middle-age  woman in no distress.  VITAL SIGNS:  Her blood pressure today was 112/72, the pulse was 62 and  regular, the respirations were 18. The weight was 188 pounds.  NECK:  Revealed no jugular venous distention.  LUNGS:  Clear bilaterally to auscultation. There were no wheezes, rales  or rhonchi.  CARDIOVASCULAR:  Revealed a regular rate and rhythm with normal S1 and  S2.  EXTREMITIES:  Demonstrated no edema.   EKG demonstrated sinus rhythm with normal axis and intervals.   IMPRESSION:  1. Atrial flutter (left atrial).  2. Chronic Coumadin therapy.  3. Chronic amiodarone therapy.  4. Congestive heart failure with preserve left ventricular function.   DISCUSSION:  Overall [name] is stable. Her heart is  maintaining  sinus rhythm very nicely. If she has recurrent palpitations and chest  pain then she is instructed to call us, otherwise we will plan to see  her back in several months.     [name]. Robin Ridgel, MD     GWT/MedQ  DD: 09/06/2006  DT: 09/07/2006  Job #: [id number]

## 2011-02-06 NOTE — H&P (Signed)
NAME:  UNKNOWN, FLANNIGAN               ACCOUNT NO.:  [id number]   MEDICAL RECORD NO.:  [id number]          PATIENT TYPE:  EMS   LOCATION:  MAJO                         FACILITY:  [facility]   PHYSICIAN:  Sherin Quarry, MD      DATE OF BIRTH:  12-Mar-1933   DATE OF ADMISSION:  03/23/2006  DATE OF DISCHARGE:                                HISTORY & PHYSICAL   HISTORY OF PRESENT ILLNESS:  [name] is a 77 year old lady who is a  resident of [place], [place].  [name] states that she was visiting her  sister in [place] when this evening she suddenly experienced the onset of  shortness of breath.  She had associated orthopnea and a poorly-localized  chest discomfort in the left parasternal area.  Her sister states that she  has observed that over the last week Mrs.  [name] has had increased ankle  edema.  [name] arrived at the [facility] emergency room where her blood  pressure was 141/86, pulse was 62, respirations were 18, O2 saturation was  91% on room air.  An electrocardiogram was obtained which showed only sinus  bradycardia with a heart rate of 57.  A chest x-ray showed evidence of  bilateral interstitial alveolar fluid consistent with congestive heart  failure.  An infectious process could not be ruled out.  [name] was  placed on oxygen, and we were contacted to arrange her admission.  Currently, [name] is relatively comfortable.  She denies headache or  dizziness.  She is currently having no chest pain.  She states that when she  was experiencing chest pain, there was associated diaphoresis and nausea.  Of note is that [name] had another episode of chest pain last week  which was relieved with 2 a sublingual nitroglycerin.   Of significance is that in April of this year, she presented to the  emergency room in [place], [place] complaining of dizziness and chest pain.  The details of this presentation are not clear.  She was referred to [facility] and was admitted to the coronary care unit.  She  states that she was advised that she did not have a myocardial infarction.  A scan of the brain showed evidence of multiple strokes, apparently embolic  in nature.  She thinks that she was told that she had atrial fibrillation,  and she was placed on Coumadin at that time.  The doctors told her that they  were going to do a cardiac catheterization, but when the strokes were  diagnosed, the catheterization was apparently cancelled.  Ultimately, she  was discharged on her current regimen of medication.  Her prothrombin time  has been monitored since that time.  She states that she has not seen a  cardiologist since April.   PAST MEDICAL HISTORY:   CURRENT MEDICATIONS:  1.  Aspirin 81 mg daily.  2.  Atenolol 25 mg daily.  3.  Cartia XT 180 mg daily.  4.  Coumadin which apparently she is taking 6 mg daily.  5.  Fosamax 10 mg daily.  6.  Meclizine 25 mg t.i.d. p.r.n. for dizziness.  7.  Prozac 20 mg daily.  8.  Zantac 150 mg daily.  9.  Zocor 40 mg daily.   ALLERGIES:  AMOXICILLIN.   OPERATIONS:  1.  She had a total knee replacement in January 2006.  2.  A previous hysterectomy.   MEDICAL ILLNESSES:  1.  Angina, possible atrial fibrillation (see above).  2.  History of hypertension times many years.  3.  Gastroesophageal reflux.  4.  Chronic depression.  The patient has been on Prozac since the 1980s.  5.  Hyperlipidemia.   FAMILY HISTORY:  Her mother died as result of colon cancer.  Her father  apparently died as result of a stroke.  Her sister has had a CABG and aortic  valve replacement.   SOCIAL HISTORY:  She does not smoke.  She does not use alcohol or drugs.  As  mentioned previously, she lives in [place], [place].   REVIEW OF SYSTEMS:  HEAD:  She denies headache or dizziness.  EYES:  She  denies visual blurring or clear.  EAR/NOSE/THROAT:  Denies earache, sinus  pain or sore throat.  CHEST:  See above.  Note  that she has had a persistent  nonproductive cough.  CARDIOVASCULAR:  See above.  Note that there is no  recent history of PND.  GI:  As mentioned, she has had nausea in association  with her symptoms.  There is been no hematemesis or melena.  No change in  bowel habits.  GU:  Denies dysuria or urinary frequency.  NEUROLOGIC:  See  above.  ENDOCRINE:  Denies excessive thirst, urinary frequency or nocturia.   PHYSICAL EXAMINATION:  HEENT:  Within normal limits.  CHEST:  Bilateral rales one-half of the way up.  I did not hear any  wheezing.  CARDIOVASCULAR:  Normal S1-S2.  There are no rubs, murmurs or gallops.  ABDOMEN:  Benign.  There are normal bowel sounds without masses or  tenderness.  NEUROLOGIC:  Testing is within normal limits.  EXTREMITIES:  Reveals 2+ ankle edema.   IMPRESSION:  1.  Apparent acute onset of congestive heart failure.  2.  Rule out myocardial infarction.  3.  History of multiple strokes, April of 4007, possibly embolic.  4.  History of atrial fibrillation in April of 2007.  5.  History of angina per patient.  6.  History of hypertension.  7.  History of depression.  8.  History of gastroesophageal reflux.   PLAN:  The patient will be administered oxygen.  Will give IV Lasix 40 mg  and a nitroglycerin.  A Foley catheter will be placed.  A BNP will be  monitored.  Will rule out MI with serial enzymes and obtain a 2-D echo.  It  appears that a cardiology consult will be warranted.  Will continue the  atenolol and Cartia and adjust Coumadin.  I am going  to add empiric antibiotic therapy with Avelox, although it appears that this  is primarily congestive heart failure rather than pneumonia.  It is going to  be important to obtain this patient's old records, either from [facility] or from Dr. Reinaldo Raddle.           ______________________________  Sherin Quarry, MD    SY/MEDQ  D:  03/23/2006  T:  03/23/2006  Job:  [id number]   cc:   Adele Barthel, M.D.   [place], [place]

## 2011-02-06 NOTE — H&P (Signed)
[name], [name]               ACCOUNT NO.:  [id number]   MEDICAL RECORD NO.:  [id number]          PATIENT TYPE:  INP   LOCATION:  5735                         FACILITY:  [facility]   PHYSICIAN:  Jefry H. Pollyann Kennedy, MD     DATE OF BIRTH:  August 25, 1933   DATE OF ADMISSION:  12/25/2006  DATE OF DISCHARGE:                              HISTORY & PHYSICAL   REASON FOR CONSULTATION AND ADMISSION:  Intractable epistaxis.   HISTORY:  77 year old lady on Coumadin, had onset of right-sided  anterior epistaxis 2 days prior and has had persistent bleeding despite  being packed in the emergency department, and just prior to my arrival  her packing had come out.  She has a history of hypertension and atrial  fibrillation.  Her Coumadin has been stopped and she has actually had  some FFP ordered, which was okayed by her cardiologist.  She actually  lives in [place] but was visiting a friend here in [place].  No prior history of nasal problems or nosebleeds.   EXAMINATION:  Healthy-appearing elderly lady, holding her nose with some  fresh blood and clot around her nasal area.  Oral cavity and pharynx are  clear except for some clot in the posterior pharynx that we eventually  cleared out.  There are no palpable neck masses.  Nasal cavity reveals  some clot on the left side, active bleeding on the right side.  I was  able to evacuate all clots and fresh blood from the right nasal cavity.  Nasal endoscopy was performed with the flexible fiberoptic scope and the  only suspicious area was the midupper septum right up against the middle  turbinate where there was some fresh blood and some granulation tissue.  This area was thoroughly evacuated and packed tightly with Surgicel.  The remainder of the nasal cavity was clear and was packed with a  Merocel packing, which was then inflated with the local anesthetic  solution that was also used in the nose.  That was 1% Xylocaine with  epinephrine.  She  tolerated this well.  She will be admitted to the  hospital for observation overnight, and if any further bleeding occurs,  further action will be taken.  Otherwise, she will be discharged in the  morning.      Jefry H. Pollyann Kennedy, MD  Electronically Signed     JHR/MEDQ  D:  12/25/2006  T:  12/26/2006  Job:  [id number]

## 2011-02-06 NOTE — Consult Note (Signed)
NAMEHONORA, SEARSON               ACCOUNT NO.:  [id number]   MEDICAL RECORD NO.:  [id number]          PATIENT TYPE:  INP   LOCATION:  1823                         FACILITY:  [facility]   PHYSICIAN:  Charlton Haws, M.D.     DATE OF BIRTH:  1933/04/10   DATE OF CONSULTATION:  03/23/2006  DATE OF DISCHARGE:                                   CONSULTATION   Ms. [name] is a delighted 77 year old patient from [place].  She has not been  seen at [facility] before.  She was admitted by Dr. Tresa Endo.   The patient has increasing shortness of breath and lower extremity edema  over the last few weeks.  She has had somewhat of a complicated history  since April.  She was admitted to [facility] for rule out MI and  question embolic CVAs.  Apparently, she ruled out for myocardial infarction.  They were going to do a heart catheterization but a CT scan showed question  of multiple previous emboli.  The patient denies any focal neurological  deficits.  She does indicate some difficulty with her speech but this is  when she was short of breath.  She was placed on Coumadin after that  hospitalization.   She was brought to the emergency room by her sister for increasing shortness  of breath.  She was found to be in sinus rhythm with pulmonary edema.  Initial enzymes are negative.  Her BNP was greater than 300 and her chest x-  ray showed significant bilateral revascularization.   She is not having any chest pain.   REVIEW OF SYSTEMS:  Is remarkable for no bleeding diathesis.  She has not  had any focal neurological deficits.   She denies any significant asthma or COPD.  She does admit to significant  weight gain and lower extremity edema over the last few weeks.   She denies any allergies.  She lives independently in [place].  She has two  other sisters and a brother.  She has had a right total knee replacement and  hysterectomy as well as appendectomy.   FAMILY HISTORY:  Remarkable for colon  cancer on the mother's side and  previous CVAs on the father's side.   She has one sister who is in the room with her who just had CABG and AVR by  Dr. Laneta Simmers in February.   EXAMINATION:  VITAL SIGNS:  Remarkable for saturations of 93% now.  Blood  pressure is 130/70, sinus rhythm at 70.  LUNGS:  Have basilar rales.  CARDIOVASCULAR:  Carotids are normal.  JVP is not visible.  There is an S1,  S2 with normal heart sounds.  ABDOMEN:  Benign.  She has a hysterectomy and appendectomy scar.  EXTREMITIES:  Distal pulses are intact with trace edema.  GENITOURINARY:  She has a Foley in.   EKG shows no acute changes.  Labs are pending including her INR.  Point-of-  care enzymes are negative.   IMPRESSION:  I am not sure what to make of the patient's previous question  of multiple embolic phenomena.  She clearly does  not have any focal deficits  and has been on Coumadin without a problem.  She appears to be having  stuttering unstable angina with congestive heart failure disproportionate to  her EKG changes.  This would suggest a component of ischemia.   I had a long discussion with the patient and her sister and explained to her  that I would like to diurese her for 48 hours, let her Coumadin level come  down, and proceed with right and left heart catheterization on Thursday.  The risks of catheterization including stroke were discussed but I suspect  that she does have significant coronary disease and she is at higher risk  for recurrent atrial fibrillation and stroke should her filling pressures  rise and she have recurrent heart failure.   The sister and patient are in agreement with this.  She will stay in the  hospital until this time.  She will continue 40 of IV Lasix b.i.d. that was  prescribed by the primary care doctors.  When her INR falls below 2 she will  start heparin per pharmacy protocol.  She will continue her beta blocker.   Further recommendations will be based on the  results of her echocardiogram  and heart catheterization.           ______________________________  Charlton Haws, M.D.     PN/MEDQ  D:  03/23/2006  T:  03/23/2006  Job:  [id number]

## 2011-02-06 NOTE — Assessment & Plan Note (Signed)
[facility]                         ELECTROPHYSIOLOGY OFFICE NOTE   SUNDRA, HADDIX                  MRN:          [id number]  DATE:10/16/2006                            DOB:          [birth year]    Ms. Laramee returns today for followup.  She is a very pleasant elderly  woman with a history of multiple medical problems including coronary  disease and atrial flutter (left atrial), also with a history of stroke,  also with a history of hypertension, who was hospitalized for chest pain  and catheterization, demonstrated that she in fact had an aortic  dissection which was well contained (type 3).  She returns today for  follow-up.  In the interim, the patient has undergone stress Myoview  study, because she was at catheterization found to have mild to moderate  in-stent restenosis and a tight stenosis over apical LAD, not thought to  be a candidate for percutaneous intervention.  She has had no chest pain  in the recent past.  Her stress Myoview scan demonstrated a slight  degree of anteroseptal ischemia, thought to be an overall low-risk  study.  Otherwise, she had no specific complaints today.  Her vision is  stable.  Of note, the patient did have a CT scan of the chest performed  at the time of her documented aortic dissection, which demonstrated a  residual 1-cm lung nodule.  The patient does not have a history of  tobacco abuse.   PHYSICAL EXAMINATION:  GENERAL:  She is a pleasant 77 year old woman in  no acute distress.  VITAL SIGNS:  The blood pressure today was one 118/70, the pulse 53 and  regular, respirations were 18.  The weight was 175 pounds.  HEENT:  Exam normocephalic and atraumatic.  Pupils equal and round.  Oropharynx moist.  Sclerae anicteric.  NECK:  Revealed no jugular distention.  There is no thyromegaly.  LUNGS:  Clear bilaterally to auscultation.  No wheezes, rales or rhonchi  are present.  CARDIAC:  Regular rate and  rhythm.  Normal S1-S2.  There are no murmurs,  rubs or gallops.  EXTREMITIES:  Demonstrated no cyanosis, clubbing or edema.   MEDICATIONS:  Include:  1. Aspirin 81 a day.  2. Nexium 40 a day.  3. Coumadin as directed.  4. Lisinopril 10 mg daily.  5. Furosemide 40 twice a day.  6. Amiodarone 200 a day.  7. Simvastatin 40 a day.  8. Fluoxetine 20 a day.   IMPRESSION:  1. Left atrial flutter and atrial fibrillation.  2. History of stroke.  3. Coronary artery disease status post percutaneous intervention with      residual disease, and a low-risk of stress Myoview study.  4. Aortic dissection well contained  5. Lung nodule.   DISCUSSION:  Overall, Ms. Evans is stable.  I will have her come back to  see me in 2 months and will plan to repeat the CT scan of her chest, to  make sure that her solitary lung nodule has not grown in size.     [name]. Ladona Ridgel, MD  Electronically Signed    GWT/MedQ  DD: 10/17/2007  DT: 10/17/2007  Job #: [id number]   cc:   Adele Barthel, MD in [place]

## 2011-02-06 NOTE — Assessment & Plan Note (Signed)
[facility]                              CARDIOLOGY OFFICE NOTE   [name], [name]                        MRN:          [id number]  DATE:03/31/2006                            DOB:          12-25-1932    SUBJECTIVE:  [name] is a very pleasant 77 year old female patient who  is visiting from [place], [place], who was recently admitted by the  hospitalist service with chest pain and shortness of breath.  She recently  had been seen at [facility] a couple of months ago and diagnosed with  multiple embolic infarcts on scanning of her head.  She apparently was told  that she had atrial fibrillation, was started on Coumadin.  They wanted to  do a heart catheterization at that time, but that was deferred until a later  date.  We saw her in consultation in the hospital recently and Dr. Eden Emms  saw her and performed a heart catheterization. This showed normal coronary  arteries.  Her wedge pressure was 15 and Dr. Eden Emms felt she had diastolic  dysfunction and she was diuresed. The patient was in sinus rhythm at  discharge.  She called the office today with complaints of nausea and  vomiting.  This has been going on for a couple of days now.  She denies any  fevers, chills, or diarrhea. She does have gastroesophageal reflux disease  and has noted some GI upset lately.  She denies any chest pain. She has  chronic shortness of breath with exertion over the last couple of months.  There has really been no change there.  She is sleeping on two pillows.  She  denies paroxysmal nocturnal dyspnea.  She denies any syncope.   CURRENT MEDICATIONS:  1.  Potassium 20 mEq daily.  2.  Ranitidine 150 mg b.i.d.  3.  Fluoxetine 20 mg daily.  4.  Warfarin as directed.  5.  Cartia XT 180 mg daily.  6.  Atenolol 25 mg daily.  7.  Simvastatin 40 mg daily.  8.  Furosemide 40 mg b.i.d.  9.  Fosamax 10 mg daily.  10. Tylenol p.r.n.  11. Meclizine p.r.n.   ALLERGIES:   AMPICILLIN.   PHYSICAL EXAMINATION:  GENERAL: She is a well-developed, well-nourished  female in no distress.  VITAL SIGNS:  Blood pressure 98/56, pulse 88, weight 188 pounds.  HEENT:  Unremarkable.  NECK:  No JVD.  CARDIOVASCULAR:  S1 and S2.  Regular rate and rhythm without murmurs.  LUNGS: Clear to auscultation bilaterally without rales, rhonchi, or wheezes.  ABDOMEN:  Soft, nontender, with normoactive bowel sounds.  No organomegaly  noted.  No rebound or guarding.  EXTREMITIES:  Without edema.   Electrocardiogram reveals atrial flutter with variable AV block with a rate  of 88, nonspecific ST-T wave changes.   IMPRESSION:  1.  Paroxysmal atrial fibrillation/flutter.  2.  Chronic diastolic dysfunction.  3.  Good left ventricular function with ejection fraction of 65% to 70% by      recent catheterization.  4.  Normal coronary arteries by recent catheterization.  5.  History of  embolic cerebrovascular accidents diagnosed at [facility].  6.  Nausea and vomiting.  7.  Hypertension.  8.  Depression.  9.  Gastroesophageal reflux disease.  10. Hyperlipidemia.   PLAN:  I discussed the patient's case with Dr. Tenny Craw. We reviewed her EKG  with Dr. Ladona Ridgel.  Dr. Ladona Ridgel noted that the appearance of her atrial flutter  makes it likely that she has mixed atrial flutter and atrial fibrillation.  We will set her up for Holter monitor to assess her rate control.  She is  already on Coumadin and will need to stay on that permanently.  We will keep  all her medications the same at this point in time, except I have asked her  to stop her Zantac and switched to Protonix 40 mg daily.  I am not convinced  that her nausea and vomiting is at all related to her atrial flutter.  She  more than likely has a gastrointestinal etiology causing this.  Her  abdominal exam is benign.  We will go ahead and get a BMET, BNP, and a CBC  today. If her BNP is elevated then we may  need to increase her Lasix.  Initially when she called in the office earlier today we asked her to cut  one of her Lasix out and continue on her potassium. Will assess her  potassium level with her BMET today to make sure that this not contributing  at all to her nausea and vomiting.  On her EKG her QT is not prolonged.  She  is due for follow-up next week with Dr. Eden Emms and she should keep that  appointment.                                  Tereso Newcomer, PA-C    SW/MedQ  DD:  03/31/2006  DT:  04/01/2006  Job #:  [id number]

## 2011-02-06 NOTE — Discharge Summary (Signed)
NAMELANDON, TRUAX               ACCOUNT NO.:  [id number]   MEDICAL RECORD NO.:  [id number]          PATIENT TYPE:  INP   LOCATION:  5506                         FACILITY:  [facility]   PHYSICIAN:  Madaline Savage, MD        DATE OF BIRTH:  1932/09/26   DATE OF ADMISSION:  12/31/2006  DATE OF DISCHARGE:  01/02/2007                               DISCHARGE SUMMARY   PRIMARY CARE PHYSICIAN:  Dr. Adele Barthel in [place], [place].   EAR/NOSE/THROAT DOCTOR:  Dr. Pollyann Kennedy.   DISCHARGE DIAGNOSES:  1. Anemia secondary to recent epistaxis and general blood loss.  2. History of congestive cardiac failure which is compensated.  3. History of atrial fibrillation.  4. Gastroesophageal reflux disease.  5. Dyslipidemia.  6. Depression.   HISTORY OF PRESENT ILLNESS:  For full history and physical, see the  history and physical dictated by Dr. Lyda Perone on December 31, 2006.  In short,  Ms. Neidhardt is a 77 year old lady with a history of atrial fibrillation  and CHF who was on Coumadin who was recently admitted with epistaxis.  She was apparently seen by Dr. Pollyann Kennedy at that time and her hemoglobin was  12.6 at the time of discharge.  Her nose was packed since then and then  she had seen Dr. Pollyann Kennedy as an outpatient 2 days prior to admission and  her pack was removed.  She was admitted with complaints of weakness and  nausea and vomiting.  On initial evaluation she was found to have a  hemoglobin of 8.6 which had dropped from 12.6 at the time of discharge.  She was transfused 2 units and admitted for observation.   PROBLEM LIST:  1. Anemia.  Her anemia is an acute blood loss anemia secondary to her      epistaxis.  She has not noted anymore bleeding from her anterior      nares or has she noted any blood in her throat.  On examination,      she did not have any blood on the back of her throat.  I discussed      her case with Dr. Jenne Pane who was on-call for Dr. Pollyann Kennedy.  Since she      was not having any overt bleeding  and the back of her throat did      not show any blood, he believes that her bleeding has completely      stopped.  So, we had observed her for 24 hours after transfusion      and her hemoglobin has remained stable and there was no sign of      anymore epistaxis.  2. Nausea.  Her nausea and vomiting has also resolved which is      probably secondary to her epistaxis.  At this time she is stable to      be discharged.  She will be followed up by Dr. Pollyann Kennedy in 1 week's      time.  3. Congestive cardiac failure.  She is stable from a heart failure      standpoint.  We will continue  her on the Lasix.  4. Atrial fibrillation.  She is on amiodarone.  We will continue her      on amiodarone.   DISPOSITION:  She has now been stable enough to be discharged home in a  stable condition.   FOLLOW UP:  She is asked to follow up with ENT doctor, Dr. Pollyann Kennedy in 1  week and also follow up Dr. Reinaldo Raddle, her family doctor in 2 weeks.      Madaline Savage, MD  Electronically Signed     PKN/MEDQ  D:  01/02/2007  T:  01/02/2007  Job:  [id number]

## 2011-02-06 NOTE — Assessment & Plan Note (Signed)
OFFICE VISIT   Robin, MIZZELL [name]  DOB:  [birth year]                                       10/17/2008  [id number]#:[id number]   The patient returns for follow-up today for an aortic dissection that we  have been following.  She continues to deny any chest, abdomen or back  pain.  She has had no weight loss.  She has no symptoms of gut or renal  ischemia.  She has no claudication symptoms.  She has had some balance  issues recently.  She is currently walking with a walker.  She saw Dr.  Pearlean Brownie with neurology recently and was not found to have any major  neurologic deficits, other than balance issues.   She remains on Coumadin and aspirin.  She is also on digoxin 0.125 mg  once a day.  Her isosorbide, Klor-Con and amiodarone have been DC'ed.   PHYSICAL EXAMINATION:  Blood pressure is 117/83 in the left arm, pulse  is 85 and regular.  HEENT:  Unremarkable.  She has 2+ carotid pulses without bruit.  CHEST:  Clear to auscultation.  CARDIAC EXAM:  Regular rate rhythm without murmur.  ABDOMEN:  Soft, nontender, nondistended with no pulsatile mass.  She has  2+ femoral pulses bilaterally.   She had a CT angio of the chest, abdomen and pelvis from today which was  reviewed.  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  All the visceral vessels are well perfused as  well as the renal vessels.  The section extends from the lower  descending thoracic aorta down into the right iliac artery.  Of note,  she still has a 1 cm right lung nodule which has been stable in  appearance.   The patient overall is unchanged since her last visit in July 2009.  She  is fairly frail and is now requiring use of a walker for ambulation.  Fortunately, her dissection does not require any surgical intervention  at this point.  We will continue to follow her with serial CT scans.  She will see me again in 1 year.  We will also reevaluate the lung  nodule  with her CT scan of chest and pelvis at that time.   Robin Hora. Fields, MD  Electronically Signed   CEF/MEDQ  D:  10/18/2008  T:  10/18/2008  Job:  [id number]   cc:   [name]. Ladona Ridgel, MD  Dr. Molly Maduro Prior

## 2011-02-06 NOTE — Op Note (Signed)
[name], [name]               ACCOUNT NO.:  [id number]   MEDICAL RECORD NO.:  [id number]          PATIENT TYPE:  OIB   LOCATION:  2899                         FACILITY:  [facility]   PHYSICIAN:  [name]. Ladona Ridgel, M.D.  DATE OF BIRTH:  12-21-1932   DATE OF PROCEDURE:  04/29/2006  DATE OF DISCHARGE:                                 OPERATIVE REPORT   PROCEDURE PERFORMED:  Electrophysiologic study followed by DC cardioversion.   INTRODUCTION:  The patient is a 77 year old woman with a history of  paroxysmal atrial flutter associated with congestive heart failure symptoms.  She also has a history of TIAs and is on Coumadin.  The patient is now  referred for electrophysiologic study and catheter ablation of her atrial  flutter.   PROCEDURE:  After informed consent was obtained, the patient was taken to  the diagnostic EP lab in the fasting state.  After usual preparation and  draping, intravenous fentanyl and midazolam was given for sedation.  A 6-  [place] hexapolar catheter was inserted percutaneously in the right jugular  vein and advanced to the coronary sinus.  A 5-French quadripolar catheter  was inserted percutaneously in the right femoral vein and advanced to the  right ventricle.  A 5-French quadripolar catheter was inserted  percutaneously in the right femoral vein and advanced the His bundle region.  A 7-French 20-pole halo catheter was inserted percutaneously in the right  femoral vein and advanced to the right atrium.  After measurement of the  basic intervals, programmed atrial stimulation was carried out from the  coronary sinus and the right atrium with base drive cycle length of 433  milliseconds.  The S1/S2 interval was stepwise decreased down to 420  milliseconds where the AV node ERP was observed.  Additional decrements were  subsequently carried out down to 290 milliseconds where atrial  refractoriness was observed.  There were no arrhythmias induced.  Next,  rapid  atrial pacing was carried out from the coronary sinus and the high  right atrium, pacing cycle length of 500 milliseconds and stepwise decreased  down to 450 milliseconds where AV Wenckebach was observed.  During rapid  atrial pacing, the PR interval was less than the RR interval, and there was  initially no SVT induced.  Additional decrements then carried out down to  250 milliseconds, resulting in the initiation of atrial flutter.  This was  atypical appearing flutter.  The activation in the left atrium was earliest  with the proximal coronary sinus latest with the distal coronary sinus which  initially suggested a right atrial flutter.  However, activation of the  right atrium based on the halo was earliest at the atrial septum and did not  appear to be perfectly counterclockwise.  At this point, the 7-French  quadripolar ablation catheter was inserted into the femoral vein and  advanced into the right atrium.  Entrainment mapping was subsequently  carried out.  Entrainment mapping was carried out in the high right atrium,  along the atrial septum, along the lateral wall of the right atrium, along  the floor of the right  atrium and along the His bundle region of the right  atrium.  This demonstrated earliest post pacing interval at the atrial  septum.  In the atrial flutter isthmus and in the lateral wall of the right  atrium, there were very long post pacing intervals.  In addition,  entrainment mapping was carried out in the left atrium at multiple sites in  the coronary sinus from 3 o'clock down to 6 o'clock on the mitral annulus.  In all these locations, post pacing intervals were long.  All the above  suggested that this patient in fact had a posterior left atrial flutter  utilizing the right pulmonary veins for maintenance of their circuit.  At  this point, pacing was carried out in hopes to pace terminate the flutter,  but this was unsuccessful.  During pacing, the patient had  multiple  different atrial flutter morphologies demonstrated as well as transient  periods of atrial fibrillation.  At this point, the decision was made to  initiate ibutilide, and 1 mg of ibutilide was delivered over the course of  10 minutes.  This demonstrated marked slowing of the atrial flutter down to  approximately 450 milliseconds.  However, atrial flutter persisted.  At this  point, the patient was more deeply sedated with fentanyl and Versed, and 100  joules of synchronized biphasic energy were delivered which terminated  atrial flutter, but there was early return of atrial flutter immediately  after the cardioversion.  The patient was resedated, and a 200-joule  synchronized biphasic energy shock was delivered again with early return of  atrial flutter.  At this point, the decision to try additional  cardioversions was decided not to be taken, and the catheters were removed.  Hemostasis was assured, and the patient was returned to her room in  satisfactory condition.   COMPLICATIONS:  There were no immediate procedure complications.   RESULTS:  1. Baseline ECG.  The baseline ECG demonstrates sinus rhythm with normal      axis and intervals.  The AH interval was 65 milliseconds, the HV      interval 52 milliseconds, the QRS duration was 90 milliseconds.  The      sinus node cycle length was almost 1300 milliseconds  2. Rapid ventricular pacing.  Rapid ventricle pacing demonstrated VA      dissociation.  3. Programmed ventricular stimulation.  Programmed ventricular stimulation      demonstrated VA dissociation at 600 milliseconds.  4. Rapid atrial pacing.  Rapid atrial pacing demonstrated AV Wenckebach at      450 milliseconds.  Additional rapid atrial pacing resulted in the      initiation of atrial flutter at a pacing cycle length of 250      milliseconds.  5. Programmed atrial stimulation.  Programmed atrial stimulation was     carried out from the coronary sinus and high  right atrium, base drive      cycle length of 500 milliseconds, stepwise decreased down to 420      milliseconds where the AV node ERP was observed.  Additional decrements      down to 290 milliseconds resulting in atrial refractoriness.  6. Arrhythmias observed:      a.     Atrial flutter.  Initiation rapid atrial pacing, duration was       sustained, termination was with DC cardioversion.      b.     Atrial flutter.  Initiation rapid atrial pacing, duration was  sustained, cycle length 220 milliseconds, method of termination was       spontaneous.   CONCLUSION:  This study demonstrates inducible left atrial flutter with  multiple flutter morphology consistent with multiple atrial flutter  circuits.  There was no evidence of typical flutter induced.  The patient  was given ibutilide which did not terminate the flutter and subsequently  given 100 and then 200 joules of DC cardioversion with early return of  atrial flutter.  The patient will be admitted to the hospital and be on  antiarrhythmic drug therapy.  With her sinus node dysfunction, there is a  chance that she ultimately require permanent pacing.           ______________________________  [name]. Ladona Ridgel, M.D.     GWT/MEDQ  D:  04/29/2006  T:  04/29/2006  Job:  [id number]   cc:   Charlton Haws, M.D.

## 2011-02-06 NOTE — H&P (Signed)
[name], [name]               ACCOUNT NO.:  [id number]   MEDICAL RECORD NO.:  [id number]          PATIENT TYPE:  EMS   LOCATION:  MAJO                         FACILITY:  [facility]   PHYSICIAN:  Manning Charity, MD     DATE OF BIRTH:  06/19/1933   DATE OF ADMISSION:  12/31/2006  DATE OF DISCHARGE:                              HISTORY & PHYSICAL   PRIMARY CARE PHYSICIAN:  Dr. Adele Barthel in [place], [place].   CHIEF COMPLAINT:  Weakness, nausea and vomiting.   HISTORY OF PRESENT ILLNESS:  The patient is a 77 year old woman with a  past medical history of A-flutter, CHF, chronic nausea/vomiting, acid  reflux disease, transient ischemic attacks, who presents with one week  of nausea, vomiting, lightheadedness, generalized weakness after  presenting to the emergency room with diffuse epistaxis for which she  was hospitalized.  She said at the time of hospitalization she was  vomiting profusely hematemesis with bright red blood in her emesis.  Since that time she has been vomiting every day, mostly posttussive but  sometimes after a meal.  She denies any blood in her vomit.  She denies  any melena.  No hematochezia.  No abdominal pain.  Nausea/vomiting.  Of  note, she has had diffuse weakness and has had to use a walker, which is  new for her.  She has also had lightheadedness and two syncopal  episodes.  At the time of the syncope she had just risen from lying  down, and she did feel lightheaded before she fell.  She denied any  chest pain or shortness of breath, any neurologic symptoms or any  changes in her vision before passing out.  Syncopal episodes were only  for a couple of seconds, and she came to immediately with no postictal  or confusion state after the syncopal episodes.   PAST MEDICAL HISTORY:  Atrial flutter, diastolic dysfunction,  nonobstructive coronary artery disease, acid reflux disease, status post  hysterectomy, status post total knee replacement, transient  ischemic  attacks, believed to be secondary to embolic sources, dyslipidemia, and  depression.   FAMILY HISTORY:  Mother is deceased from colon cancer.  She also has  multiple cousins with colon cancer.  Father is deceased from a CVA.  A  sister has coronary artery disease.   SOCIAL HISTORY:  She lives in [place], [place].  She denies any alcohol,  tobacco or illegal drug use.   HOME MEDICATIONS:  She is currently on:  1. Metoprolol XL 25 mg daily.  2. Fosamax 10 mg daily.  3. Has previously been on Coumadin 7.5 mg Saturday, 5 mg Sunday, but      this has been held secondary to epistaxis.  4. She is also on amiodarone 200 mg daily.  5. Fluoxetine 20 mg daily.  6. Lasix 40 mg b.i.d.  7. Protonix 40 mg daily.  8. Simvastatin 40 mg q.h.s.  9. K-Dur 10 mEq daily.   ALLERGIES:  AMOXICILLIN CAUSES HIVES.   REVIEW OF SYSTEMS:  Ms. Siragusa denies any fevers or chills.  She denies  any chest pain, any increased shortness  of breath, although she has had  a cough.  She also denies any orthopnea, nocturia.  She denies any  abdominal pain.  She has been nauseated with multiple episodes of  emesis, although this is not necessarily new for her as she has chronic  nausea and vomiting secondary to a weak stomach.  She denies any  melena, hematochezia, hematemesis or coffee-ground emesis.  Again,  denies any abdominal pain.  She denies any dysuria.  Denies any  neurologic changes.   PHYSICAL EXAMINATION:  VITAL SIGNS:  Temperature 98.3, pulse 66, blood  pressure 96/62, respiratory rate 16, she is satting 92% on room air.  GENERAL:  She is slightly pale.  No acute distress.  NECK:  Without JVD or thyromegaly.  CHEST:  With some coarse breath sounds at the bases but good air  movement and no distinct crackles.  HEART:  Regular rate and rhythm with no murmurs, rubs or gallops  appreciated.  ABDOMEN:  Positive bowel sounds.  Soft.  Minimal tenderness to palpation  throughout with no focal area  of tenderness.  No rebound, no guarding.  RECTAL:  Minimal stool in the vault that is light brown, and it is trace  positive with developer for blood.  EXTREMITIES:  Trace edema to the knees.  SKIN:  Without any rashes.  NEUROLOGIC:  She is alert and oriented x4.  She is nonfocal.   LABORATORY STUDIES:  White blood cell count is 10.4, hemoglobin 8.6,  which is down from 12.6 on April 5, platelet count is 247,000.  A BMET  shows a sodium of 136, potassium of 3.8, chloride 104, bicarbonate 23,  BUN 22, creatinine 1.5, glucose 113.  Her MCV is 86 with an RDW of 16.  Her INR on April 5 was 2.7.   ASSESSMENT AND PLAN:  1. Acute anemia, most likely secondary to epistaxis and general blood      loss.  Suspect that her nausea and vomiting is some ongoing mild      unperceived continued epistaxis.  At the time of my examination the      patient already had blood hanging.  Therefore we cannot evaluate      with further laboratory studies as any of our exams would be      tainted by the transfusion itself.  Will go ahead and transfuse 2      units with Lasix after each unit.  Will repeat a hemoglobin and      monitor for stability.  If her hemoglobin remains stable over 24      hours she an proceed as an outpatient evaluation for her anemia.  2. Nausea/vomiting.  Again, this has been partly chronic, although it      has been worse.  I suspect that this is from unperceived continued      epistaxis causing some nausea, although she has not had any bright      red blood or coffee-ground emesis.  We will go ahead and check a      CMET.  Will also check a plain film of the abdomen and H. pylori      antibodies.  If hemoglobin is stable and symptoms subside, she can      proceed with an outpatient evaluation.  If her symptoms persist or      are uncontrollable, we may need to get GI involved for an upper      endoscopy.  For this reason she has been kept on clear  liquids. 3. Congestive heart failure.   Ms. Lambing has a history of diastolic      dysfunction.  We are going to give her some Lasix after each unit      and monitor her blood pressure carefully as well as her respiratory      status.  4. Atrial fibrillation.  Ms. Woldt' blood pressure in the emergency      room is slightly low.  Will follow this after transfusion, but for      now we are holding her antihypertensives and her rate control      medications.  Will restart her metoprolol once her blood pressure      is more stable.      Manning Charity, MD  Electronically Signed     KK/MEDQ  D:  12/31/2006  T:  12/31/2006  Job:  [id number]

## 2011-02-06 NOTE — Assessment & Plan Note (Signed)
[facility]                           ELECTROPHYSIOLOGY OFFICE NOTE   ELLISSA, [name]                  MRN:          [id number]  DATE:05/19/2006                            DOB:          [birth year]    [name] returns today for followup.  She is a very pleasant middle-aged  woman with a history of atrial flutter for which she underwent  electrophysiology study and attempted catheter ablation.  The patient,  however, was found to have a left atrial flutter which appeared to be  utilizing the posterior pulmonary veins for activation.  The patient did not  undergo catheter ablation attempts.  She now returns today for followup.  She has been placed on amiodarone to help maintain sinus rhythm, and she has  had no symptomatic episodes of atrial flutter.  She denies chest pain.  She  is on Coumadin.  She denies peripheral edema.   PHYSICAL EXAMINATION:  GENERAL:  She is a pleasant, well-appearing woman in  no distress.  VITAL SIGNS:  Blood pressure today was 127/74.  The pulse 78 and regular.  Respirations were 18.  The weight was 188 pounds.  NECK:  No jugular venous distention.  LUNGS:  Clear bilaterally to auscultation.  CARDIOVASCULAR:  Regular rate and rhythm with normal S1 and S2.  EXTREMITIES:  No edema.   The EKG demonstrates sinus rhythm with normal axis and intervals.   IMPRESSION:  1. Atypical atrial flutter.  2. Hypertension.  3. Chronic amiodarone therapy.  4. Chronic Coumadin therapy.   DISCUSSION:  Overall, [name] is stable.  Her heart is maintaining sinus  rhythm very nicely.  I will plan to see her back in four months.                                   [name]. Ladona Ridgel, MD   GWT/MedQ  DD:  05/19/2006  DT:  05/19/2006  Job #:  [id number]   cc:   Dr. Adele Barthel

## 2011-02-06 NOTE — Discharge Summary (Signed)
[name], [name]               ACCOUNT NO.:  [id number]   MEDICAL RECORD NO.:  [id number]          PATIENT TYPE:  INP   LOCATION:  3705                         FACILITY:  [facility]   PHYSICIAN:  Maple Mirza, P.A. DATE OF BIRTH:  September 25, 1932   DATE OF ADMISSION:  04/29/2006  DATE OF DISCHARGE:                                 DISCHARGE SUMMARY   ALLERGIES:  AMPICILLIN.   PRINCIPAL DIAGNOSES:  1. History of atrial flutter rapid ventricular rate.  2. Day 2 status post electrophysiology study.      a.     Posterior left-sided flutter utilizing pulmonary veins - unable       to ABLATE.      b.     DC cardioversion x2, unable to convert.  3. Bradycardic after the procedure.      a.     Cardizem and atenolol discontinued.      b.     Started on amiodarone 200 mg daily.   SECONDARY DIAGNOSES:  1. Catheterization in the past showing nonobstructive coronary artery      disease.  2. History of TIA's/Coumadin.  3. Status post knee surgery.  4. Dyslipidemia.  5. Depression.   PROCEDURE:  April 29, 2006 - electrophysiology study - unable to ABLATE  posterior left-sided flutter.  Cardioversion attempted x2, unable to convert  to sinus rhythm.  Dr. Lewayne Bunting has placed the patient on amiodarone for  rhythm suppression.   BRIEF HISTORY:  [name] is a 77 year old female.  She developed sudden  onset of shortness of breath and was found to be in atrial flutter with  rapid ventricular response.  This was when she was visiting her sister here  in [place].  The patient herself, lives in [place].  She was admitted to  the emergency room.  Her cardiac enzymes were borderline.  Her BNP was  elevated.  She had catheterization which showed no obstructive coronary  disease.  The patient was ultimately discharged home with a rate control  strategy which included Cardizem and atenolol.   Remotely, the patient states she has had transient ischemia attacks.  She  presented to [facility] for those several months ago.  Her symptoms  have resolved, and she was placed on Coumadin for this.  She denies any  history of syncope.  She has no peripheral edema.  Her additional history is  notable for multiple knee surgeries including a knee replacement.  She has a  history of significant weight gain in the past.  Has a history of  dyslipidemia and chronic depression.  The risks and benefits of  electrophysiology study with radiofrequency catheter ablation have been  discussed with the patient.  When seen in examination, the patient has not  been on therapeutic Coumadin and so, we will follow her Coumadin closely for  a 3-week period and then bring her back for ablation.   HOSPITAL COURSE:  The patient presented electively on August 9th for  electrophysiology study.  Her presentation INR on August 9th was 1.5.  She  underwent electrophysiology study that same day and as  dictated above, this  was a left-sided atrial flutter origin and was not amenable to ablation.  The patient was then given Ibutilide 1 mg, but atrial flutter persisted.  The patient was sedated and had cardioversion x2, which we are unable to  convert from atrial flutter.  The patient will be started on amiodarone  during this hospitalization and then discharged on postprocedure day number  2.  It turns out that the patient was bradycardic and hypotensive overnight  postprocedure day number 1.  Her amiodarone was held because of this and in  the morning, the patient was still relatively bradycardic.  Her Cardizem and  atenolol were both discontinued and instead of a loading dose of amiodarone  400 mg twice daily, the patient was started on amiodarone 200 mg daily.  The  patient will go home with this 200 mg daily dose.  Because of amiodarone  loading and the fact that the patient is on Coumadin, her Coumadin dose will  invariably decrease as amiodarone loads up.  She will be seen in the  [facility]in [facility]  Monday, August 13th.  They will call her with this  appointment.   DISCHARGE ACTIVITIES:  1. For the weekend, her Coumadin dose will be 7.5 mg on Saturday and 5 mg      on Sunday.  She will report to the Coumadin Clinic Monday, August 13th.  2. Her amiodarone dose is 200 mg daily.  This is new medication.  3. Prozac 20 mg daily.  4. Lasix 40 mg twice daily.  5. Protonix 40 mg daily.  6. Simvastatin 40 mg daily at bedtime.  7. K-Dur 20 mEq tablets one-half tablet daily.  8. Fosamax 10 mg daily.   We also have a follow up appointment with Dr. Ladona Ridgel in 2 weeks.  The office  will call for her to come in to see him.  She is asked to bring this pink  sheet to the Coumadin Clinic on August 13th.  She has a stat TSH and liver  function studies to be done here at the hospital.  They should be on E-chart  for reference now that the patient is on amiodarone. These are provided  baseline.      Maple Mirza, P.A.     GM/MEDQ  D:  04/30/2006  T:  05/01/2006  Job:  [id number]   cc:   [name]. Ladona Ridgel, M.D.

## 2011-02-06 NOTE — Cardiovascular Report (Signed)
NAMEBRYTNEY, [name]               ACCOUNT NO.:  [id number]   MEDICAL RECORD NO.:  [id number]          PATIENT TYPE:  INP   LOCATION:  4730                         FACILITY:  [facility]   PHYSICIAN:  Charlton Haws, M.D.     DATE OF BIRTH:  1932/10/13   DATE OF PROCEDURE:  DATE OF DISCHARGE:                              CARDIAC CATHETERIZATION   Coronary angiography.   INDICATION:  Recurrent congestive heart failure.   Right and left heart catheterization from the right femoral artery and vein.  The 6-French arterial sheath appeared to have had a dissection flap.  We  pulled the sheath back approximately 2 cm and were able to pass a wire.  We  did long exchanges during the case and the patient had a good PT pulse.   The patient has been on Coumadin for PAF and TIAs.  Her INR was normal at  the time of the case.   Left main coronary artery was normal.   Left anterior descending artery was normal.   Circumflex coronary artery was nondominant and normal.   Right coronary artery was dominant and normal.   RAO ventriculography:  RAO ventriculography was normal.  The ejection  fraction was 65-70%.  There was +1 MR., which may have been PVC related.  Right heart catheterization was done due to the patient's dyspnea and  shortness of breath.  Mean right atrial pressure was 12, RV pressure was  29/3, PA pressure was 30/8, mean pulmonary capillary wedge pressure was 15.   IMPRESSION:  The patient would appear to have PAF with diastolic  dysfunction.  She has a good systolic function and no coronary disease.  Continued medical therapy is warranted.  She may be able to be discharged  home tomorrow with Lovenox bridging.           ______________________________  Charlton Haws, M.D.     PN/MEDQ  D:  03/26/2006  T:  03/26/2006  Job:  [id number]

## 2011-02-06 NOTE — Discharge Summary (Signed)
NAMECHAREESE, SERGENT               ACCOUNT NO.:  [id number]   MEDICAL RECORD NO.:  [id number]          PATIENT TYPE:  INP   LOCATION:  4730                         FACILITY:  [facility]   PHYSICIAN:  Corinna L. Lendell Caprice, MDDATE OF BIRTH:  11-07-1932   DATE OF ADMISSION:  03/23/2006  DATE OF DISCHARGE:  03/27/2006                                 DISCHARGE SUMMARY   DISCHARGE DIAGNOSES:  1. Chest pain, myocardial infarction ruled out.  2. Congestive heart failure exacerbation.  3. Diastolic dysfunction.  4. Paroxysmal atrial fibrillation with history of embolic stroke.  5. Right upper lobe nodule needs outpatient workup.  6. Hypertension.  7. Gastroesophageal reflux disease.  8. Depression.  9. Hyperlipidemia.   DISCHARGE MEDICATIONS:  1. Coumadin 7.5 mg for 5 days then 7 mg daily.  2. Lovenox 90 mg subcutaneously b.i.d. until INR greater than or equal to      2.0.  3. Lasix 40 mg p.o. daily.  4. KCl 20 mEq p.o. day.  5. She will continue atenolol 20 mg a day.  6. Stop aspirin.  7. Continue Cartia XT 180 mg a day.  8. Fosamax 10 mg daily.  9. Prozac 20 mg a day.  10.Zantac 150 mg a day.  11.Zocor 40 mg daily.  12.Meclizine 25 mg p.o. q.8h. p.r.n. dizziness.   CONDITION:  Stable.   CONSULTATIONS:  [facility].   PROCEDURE:  Cardiac catheterization on March 26, 2006 by Dr. Eden Emms which  shows no significant coronary artery disease and ejection fraction of 65-  70%.   FOLLOW UP:  Primary care in one to two weeks.  Follow up with cardiologist  two to four weeks.  Diet should be low-salt.  Activity ad lib.   PERTINENT LABORATORY DATA:  Cardiac enzymes negative.  UA negative.  Complete metabolic panel unremarkable.  INR on admission was 3.8; at  discharge after having held Coumadin and it was 1.3.  ABG on unrecorded  amount of oxygen showed a pH of 7.432, pCO2 35, pO2 55.  CBC on admission  was significant for a white blood cell count of 10.6 otherwise unremarkable.  Chest x-ray on admission showed bilateral pulmonary infiltrates question  edema versus infection, question nodular density in the right upper lobe  versus superimposed external artifact.  PA and lateral two days later showed  some improvement in the infiltrate, small effusions remained.  Persistent  right upper lobe nodule.  Echocardiogram showed ejection fraction of 60-65%,  no wall motion abnormalities, diastolic dysfunction.  EKG showed sinus  bradycardia with a rate of 57.   HISTORY AND HOSPITAL COURSE:  Ms. Manalang is a pleasant unassigned 77-year-  old lady from [place] who was visiting his sister and presented with  shortness of breath and chest pain.  She had orthopnea.  Please see H&P for  complete admission details.  She had rales on exam, 2+ leg edema.  Chest x-  ray was consistent with heart failure.  The patient was admitted to the step-  down unit and ruled out for MI.  Cardiology was consulted.  She had an  echocardiogram which  showed preserved ejection fraction but diastolic  dysfunction.  She was given brisk diuresis with improvement of her symptoms.  She had no further chest pain.  She had been on Coumadin as an outpatient  for paroxysmal atrial fibrillation and embolic strokes.  Her Coumadin was  held and eventually she underwent cardiac catheterization showing clear  coronaries.  At the time of discharge, her shortness of breath, leg edema,  orthopnea were much better and she was able to ambulate without difficulty.  She had a lung nodule on chest x-ray that needs to be worked up as an  outpatient.  She had had recent cardiac catheterizations so I was hesitant  to give her another load of IV contrast.  I wrote on her discharge  instructions that she will need to follow up with her primary care physician  to follow this up and she voiced understanding.  She was sent home with home  health for Lovenox injections until her INR was greater than 2.0.  Total  time on the day  of discharge was 45 minutes.      Corinna L. Lendell Caprice, MD  Electronically Signed     CLS/MEDQ  D:  04/25/2006  T:  04/26/2006  Job:  [id number]

## 2011-02-12 NOTE — Discharge Summary (Signed)
NAMEPALOMA, GRANGE               ACCOUNT NO.:  [id number]  MEDICAL RECORD NO.:  [id number]           PATIENT TYPE:  I  LOCATION:  2013                         FACILITY:  [facility]  PHYSICIAN:  [name]. Ladona Ridgel, MD    DATE OF BIRTH:  1933-07-01  DATE OF ADMISSION:  01/15/2011 DATE OF DISCHARGE:  01/19/2011                        DISCHARGE SUMMARY - REFERRING   PRIMARY CARE PHYSICIAN:  Robert L. Foy Guadalajara, MD  CARDIOLOGIST/ELECTROPHYSIOLOGIST:  [name]. Ladona Ridgel, MD  PRIMARY DIAGNOSIS:  Tachybrady syndrome with syncope.  SECONDARY DIAGNOSES: 1. Persistent atypical atrial flutter and atrial fibrillation. 2. Prior cerebrovascular accident. 3. Chronic abdominal aortic dissection in 2008 with aneurysm. 4. Diastolic dysfunction. 5. Orthostatic hypotension. 6. Abnormal thyroid function, on amiodarone. 7. Hypertension. 8. Gastroesophageal reflux disease. 9. Depression. 10.Coronary artery disease, status post PCI of the distal LAD in 2008.  ALLERGIES:  The patient is allergic to PENICILLIN and LATEX.  PROCEDURES SINCE ADMISSION: 1. Chest x-ray on January 16, 2011 demonstrated cardiomegaly without     frank congestive heart failure or pneumonia. 2. Implantation of a dual chamber pacemaker by Dr. Johney Frame on January 16, 2011.  The patient received a [employer] model number 2210,     pacemaker with model number 2088 TC right atrial lead and model     number 1948 right ventricular lead. 3. Chest x-ray on January 17, 2011 demonstrated no pneumothorax, status     post pacemaker implant. 4. A 2-D echocardiogram on January 17, 2011 demonstrated an ejection     fraction of 55% to 60% with mild aortic valve regurgitation.  BRIEF HPI:  Ms. [name] is a 77 year old female with a longstanding history of atrial arrhythmias including atypical atrial flutter and atrial fibrillation.  She is failed multiple antiarrhythmic medications including amiodarone and Multaq.  She has had difficulties with  tachycardia as well as bradycardia.  Over the past 3 weeks proceeding admission, she has had increasing symptoms of dizziness and weakness and had presented to the hospital in [place].   At her last admission to the outside hospital, she requested to be transferred to [facility] for further evaluation.  HOSPITAL COURSE:  The patient was admitted on January 15, 2011 for dizziness and presyncope with orthostatic hypotension and recurrent paroxysmal atrial fibrillation.  She was evaluated by Dr. Johney Frame with electrophysiology who felt like some of her symptoms could be attributed to tachybrady syndrome and recommended pacemaker implant.  Risks, benefits, and alternatives of the procedure were discussed with the patient, she wished to proceed.  She underwent dual chamber pacemaker implantation on January 16, 2011, by Dr. Johney Frame, there is no immediate complications.  She continued to be monitored on telemetry, which demonstrated atrial flutter with ventricular pacing.  Because of her orthostasis, her medications were adjusted over the weekend for discharge.  She is evaluated by physical therapy who recommended short term skilled nursing facility for rehab.  The patient's Coumadin has been discontinued for unclear reasons preadmission.  Because of her history of prior stroke off Coumadin, she was restarted on this medication.  Dr. Ladona Ridgel examined the patient on January 19, 2011 and  considered stable to discharge to a skilled nursing facility.  LABORATORY DATA:  INR on the date of discharge of 2.21, hemoglobin 10.6, hematocrit 32, platelets 191, white count 6.2.  TSH at this admission 5.782, potassium 3.9, BUN 19, creatinine 1.09.  Total cholesterol 94, triglycerides 32, HDL 43, LDL 45.  FOLLOWUP APPOINTMENT: 1. [facility] and [facility] in the     [place] office on Jan 21, 2011 at 9 a.m. 2. Dr. Ladona Ridgel on May 05, 2011 at 9 a.m. 3. Dr. Foy Guadalajara as  scheduled.  DISCHARGE INSTRUCTIONS: 1. Increase activity slowly. 2. Follow a low-sodium heart-healthy diet. 3. See supplemental device discharge instructions for wound care and     arm mobility. 4. Keep incisions clean and dry for 1 week.  DISCHARGE MEDICATIONS: 1. Tylenol 325 mg 1 to 2 tablets every 4 hours as needed. 2. Aspirin 81 mg daily. 3. Diltiazem CD 240 mg daily. 4. Metoprolol tartrate 25 mg one-half table twice daily. 5. Aricept 5 mg daily. 6. Warfarin 5 mg, take one tablet daily. 7. Meclizine 25 mg one tablet daily as needed for dizziness. 8. Nexium 40 mg daily. 9. Nitroglycerin sublingual 0.4 mg as needed for chest pain. 10.Chlorpromazine 25 mg every 6 hours as needed for nausea. 11.Simvastatin 20 mg one-half tablet daily. 12.Xalatan eyedrops 2 drops in each eye daily. 13.Zoloft 50 mg 1 tablet daily. 14.The patient's furosemide and lisinopril have been discontinued at     this admission.  DISPOSITION:  Home.  The patient was seen and examined by Dr. Ladona Ridgel on January 19, 2011 and considered to be stable for discharge to a skilled nursing facility.  DURATION OF DISCHARGE ENCOUNTER:  40 minutes.     Gypsy Balsam, RN,BSN   ______________________________ [name]. Ladona Ridgel, MD    AS/MEDQ  D:  01/19/2011  T:  01/19/2011  Job:  [id number]  cc:   Molly Maduro L. Foy Guadalajara, M.D.  Electronically Signed by Gypsy Balsam RNBSN on 02/09/2011 11:07:08 AM Electronically Signed by Lewayne Bunting MD on 02/12/2011 12:52:03 PM

## 2011-04-30 ENCOUNTER — Encounter: Payer: Self-pay | Admitting: Internal Medicine

## 2011-05-04 ENCOUNTER — Encounter: Payer: Self-pay | Admitting: Internal Medicine

## 2011-05-05 ENCOUNTER — Ambulatory Visit (INDEPENDENT_AMBULATORY_CARE_PROVIDER_SITE_OTHER): Payer: Medicare FFS | Admitting: Internal Medicine

## 2011-05-05 ENCOUNTER — Encounter: Payer: Self-pay | Admitting: Internal Medicine

## 2011-05-05 ENCOUNTER — Encounter: Payer: Self-pay | Admitting: *Deleted

## 2011-05-05 DIAGNOSIS — I4891 Unspecified atrial fibrillation: Secondary | ICD-10-CM

## 2011-05-05 DIAGNOSIS — I495 Sick sinus syndrome: Secondary | ICD-10-CM

## 2011-05-05 LAB — CBC WITH DIFFERENTIAL/PLATELET
Basophils Relative: 0.5 % (ref 0.0–3.0)
Eosinophils Absolute: 0.1 10*3/uL (ref 0.0–0.7)
Eosinophils Relative: 0.8 % (ref 0.0–5.0)
Hemoglobin: 10.6 g/dL — ABNORMAL LOW (ref 12.0–15.0)
Lymphocytes Relative: 16.4 % (ref 12.0–46.0)
MCHC: 32.2 g/dL (ref 30.0–36.0)
Monocytes Relative: 9.6 % (ref 3.0–12.0)
Neutro Abs: 5.1 10*3/uL (ref 1.4–7.7)
Neutrophils Relative %: 72.7 % (ref 43.0–77.0)
RBC: 4.02 Mil/uL (ref 3.87–5.11)
WBC: 7 10*3/uL (ref 4.5–10.5)

## 2011-05-05 LAB — BASIC METABOLIC PANEL
CO2: 26 mEq/L (ref 19–32)
Calcium: 9.3 mg/dL (ref 8.4–10.5)
Creatinine, Ser: 1.1 mg/dL (ref 0.4–1.2)
Sodium: 143 mEq/L (ref 135–145)

## 2011-05-05 LAB — PROTIME-INR: INR: 2.9 ratio — ABNORMAL HIGH (ref 0.8–1.0)

## 2011-05-05 NOTE — Patient Instructions (Signed)

## 2011-05-05 NOTE — Assessment & Plan Note (Signed)
Her blood pressure is well controlled today. She will continue her current Medical therapy for now.

## 2011-05-05 NOTE — Assessment & Plan Note (Signed)
Her ventricular rates have not been well-controlled. She is on maximal medical therapy limited by hypotension. I discussed the treatment options with the patient and her sister who is with her today. I've recommended AV node ablation as a way to control her ventricular rate in atrial fibrillation and reduce some of her medication. The risks, goals, benefits, and expectations of the procedure have been discussed and she wishes to proceed.

## 2011-05-05 NOTE — Progress Notes (Signed)
HPI [name] returns today for evaluation. She is a pleasant 77 yo woman with a h/o atrial fib/flutter, s/p stroke, HTN, dementia (mild), and peripheral vascular disease. Since I last saw the patient, she has had one emergency room visit and one hospitalization for atrial fibrillation with a rapid ventricular response associated with diastolic heart failure. The patient is on high dose digoxin and diltiazem and on low-dose metoprolol. She has required diuretic therapy as well in low dose. Allergies  Allergen Reactions  . Amoxicillin   . Latex      Current Outpatient Prescriptions  Medication Sig Dispense Refill  . aspirin 81 MG tablet Take 81 mg by mouth daily.        . digoxin (LANOXIN) 0.125 MG tablet Take 1 tablet by mouth Daily.      Marland Kitchen diltiazem (CARDIZEM CD) 360 MG 24 hr capsule Take 360 mg by mouth daily.        Marland Kitchen donepezil (ARICEPT) 5 MG tablet Take 5 mg by mouth daily.        Marland Kitchen esomeprazole (NEXIUM) 40 MG capsule Take 40 mg by mouth daily before breakfast.        . furosemide (LASIX) 20 MG tablet Take 20 mg by mouth as needed.       Marland Kitchen KLOR-CON M20 20 MEQ tablet Take 1 tablet by mouth Daily.      Marland Kitchen latanoprost (XALATAN) 0.005 % ophthalmic solution 1 drop at bedtime.        Marland Kitchen levothyroxine (SYNTHROID, LEVOTHROID) 50 MCG tablet Take 1 tablet by mouth Daily.      . meclizine (ANTIVERT) 25 MG tablet Take 25 mg by mouth 2 (two) times daily as needed.        . metoprolol tartrate (LOPRESSOR) 25 MG tablet Take 12.5 mg by mouth 2 (two) times daily.        . nitroGLYCERIN (NITROSTAT) 0.4 MG SL tablet Place 0.4 mg under the tongue every 5 (five) minutes as needed.        . promethazine (PHENERGAN) 25 MG tablet Take 25 mg by mouth as needed.        . sertraline (ZOLOFT) 50 MG tablet Take 50 mg by mouth daily.        . simvastatin (ZOCOR) 10 MG tablet Take 10 mg by mouth at bedtime.        Marland Kitchen warfarin (COUMADIN) 5 MG tablet Take 5 mg by mouth as directed.           Past Medical History    Diagnosis Date  . Atrial flutter     recurrent asymptomatic  . CAD (coronary artery disease)   . History of stroke   . Aortic dissection     contained  . HTN (hypertension)   . Ischemic heart disease   . MI (myocardial infarction)   . GERD (gastroesophageal reflux disease)   . Depression     ROS:   All systems reviewed and negative except as noted in the HPI.   No past surgical history on file.   Family History  Problem Relation Age of Onset  . Colon cancer Father   . Coronary artery disease Sister   . Stroke Father      History   Social History  . Marital Status: Widowed    Spouse Name: N/A    Number of Children: N/A  . Years of Education: N/A   Occupational History  . retired    Social History Main Topics  . Smoking  status: Never Smoker   . Smokeless tobacco: Not on file  . Alcohol Use: No  . Drug Use: No  . Sexually Active: Not on file   Other Topics Concern  . Not on file   Social History Narrative  . No narrative on file     BP 104/68  Pulse 62  Resp 12  Ht 5\' 1"  (1.549 m)  Wt 165 lb (74.844 kg)  BMI 31.18 kg/m2  Physical Exam:  Well appearing NAD HEENT: Unremarkable Neck:  No JVD, no thyromegally Lymphatics:  No adenopathy Back:  No CVA tenderness Lungs:  Clear HEART:  Regular rate rhythm, no murmurs, no rubs, no clicks Abd:  soft, positive bowel sounds, no organomegally, no rebound, no guarding Ext:  2 plus pulses, no edema, no cyanosis, no clubbing Skin:  No rashes no nodules Neuro:  CN II through XII intact, motor grossly intact  EKG Atrial fibrillation with ventricular pacing DEVICE  Normal device function.  See PaceArt for details. She has been in A. Fib/flutter a 100% of the time.  Assess/Plan:

## 2011-05-14 ENCOUNTER — Ambulatory Visit (HOSPITAL_COMMUNITY)
Admission: RE | Admit: 2011-05-14 | Discharge: 2011-05-15 | Disposition: A | Discharge status: Home / Self Care | Payer: Medicare FFS | Source: Ambulatory Visit | Attending: Internal Medicine | Admitting: Internal Medicine

## 2011-05-14 DIAGNOSIS — I4891 Unspecified atrial fibrillation: Secondary | ICD-10-CM | POA: Insufficient documentation

## 2011-05-14 DIAGNOSIS — Z950 Presence of cardiac pacemaker: Secondary | ICD-10-CM | POA: Insufficient documentation

## 2011-05-14 LAB — PROTIME-INR
INR: 1.63 — ABNORMAL HIGH (ref 0.00–1.49)
Prothrombin Time: 19.6 seconds — ABNORMAL HIGH (ref 11.6–15.2)

## 2011-05-15 LAB — PROTIME-INR: Prothrombin Time: 16.7 seconds — ABNORMAL HIGH (ref 11.6–15.2)

## 2011-05-19 ENCOUNTER — Ambulatory Visit (INDEPENDENT_AMBULATORY_CARE_PROVIDER_SITE_OTHER): Payer: Medicare FFS | Admitting: *Deleted

## 2011-05-19 DIAGNOSIS — Z7901 Long term (current) use of anticoagulants: Secondary | ICD-10-CM | POA: Insufficient documentation

## 2011-05-19 DIAGNOSIS — I4891 Unspecified atrial fibrillation: Secondary | ICD-10-CM

## 2011-05-19 NOTE — Discharge Summary (Addendum)
[name], [name]               ACCOUNT NO.:  [id number]  MEDICAL RECORD NO.:  [id number]  LOCATION:  3734                         FACILITY:  [facility]  PHYSICIAN:  [name]. Ladona Ridgel, MD    DATE OF BIRTH:  1932-10-14  DATE OF ADMISSION:  05/14/2011 DATE OF DISCHARGE:  05/15/2011                              DISCHARGE SUMMARY   DISCHARGE DIAGNOSES: 1. Atrial fibrillation/flutter.     a.     Status post atrioventricular node ablation May 14, 2011.     b.     History of symptomatic tachybrady syndrome status post      pacemaker insertion. 2. Hypertension. 3. Gastroesophageal reflux disease. 4. Cerebrovascular accident. 5. Peripheral vascular disease. 6. Mild dementia. 7. Coronary artery disease status post percutaneous coronary     intervention of the distal left anterior descending in 2008. 8. Aortic dissection history. 9. Diastolic heart failure.  HOSPITAL COURSE:  [name] is a 77 year old lady with a history of atrial fibrillation/flutter followed by Dr. Ladona Ridgel who has been on high- dose digoxin and diltiazem and on low-dose metoprolol.  She has had episodes of diastolic heart failure related to AFib with RVR. Ventricular rate however by medical therapy has not been well controlled.  Her medical therapy unfortunately is limited by hypotension.  Dr. Ladona Ridgel discussed treatment options with the patient and her sister in the office and ultimately recommended AV node ablation is the way to control her ventricular rate in atrial fibrillation tend to reduce some of her medications.  The risks, goals, benefits and expectations of the procedure were discussed with the patient.  She wished to proceed.  She was brought into the hospital for this procedure yesterday and ultimately had successful AV node ablation and His bundle mapping.  The patient tolerated the procedure well and was initiated back on her Coumadin.  Dr. Ladona Ridgel has seen and examined her today and feels she is  stable for discharge.  DISCHARGE LABS:  INR of 1.33.  STUDIES:  Operative report for AV node ablation, His bundle mapping May 14, 2011, please see full report for details.  DISCHARGE MEDICATIONS: 1. Aricept 5 mg daily. 2. Aspirin 81 mg daily. 3. Coumadin 5 mg half tablet to one tablet every evening as directed.     The patient was notified that no changes were made to her home     dose. 4. Lasix 40 mg daily. 5. Klor-Con 20 mEq daily. 6. Levothyroxine 50 mcg daily. 7. Meclizine 25 mg b.i.d. p.r.n. dizziness. 8. Metoprolol tartrate 25 mg half tablet b.i.d. 9. Nexium 40 mg daily. 10.Nitroglycerin sublingual 0.4 mg every 5 minutes as needed up to 3     doses for chest pain. 11.Promethazine 25 mg p.o. q.6 h. p.r.n. nausea. 12.Simvastatin 20 mg half tablet nightly. 13.Xalatan 0.005% ophthalmic 1 drop both eyes daily nightly. 14.Zoloft 50 mg daily.  Please note per Dr. Ladona Ridgel that digoxin and diltiazem have been discontinued this admission post AV node ablation.  DISPOSITION:  Dr. Sherene Sires will be discharging her in stable condition to home.  She is not to lift or participated any sexual activity for 3 days.  She is to follow a low-sodium heart-healthy diet  and to call or return if she notices any pain, swelling, bleeding, or pus at the cath site.  She typically gets her INR checked at the [facility] in [place] per her primary care provider and was given a lab slip to take to have her level checked on May 19, 2011, and she should also follow up with a primary care provider for the results of this as well.  DURATION OF DISCHARGE ENCOUNTER:  Greater than 30 minutes including physician and PA time.     [name], P.A.C.   ______________________________ [name]. Ladona Ridgel, MD    DD/MEDQ  D:  05/15/2011  T:  05/15/2011  Job:  [id number]  Electronically Signed by Ronie Spies  on 05/19/2011 06:59:27 PM Electronically Signed by Lewayne Bunting MD on 07/02/2011 06:40:00 PM

## 2011-06-23 ENCOUNTER — Encounter: Payer: Medicare FFS | Admitting: Internal Medicine

## 2011-06-26 LAB — CARDIAC PANEL(CRET KIN+CKTOT+MB+TROPI)
CK, MB: 1.4
CK, MB: 1.6
Total CK: 64
Troponin I: 0.02

## 2011-06-26 LAB — TROPONIN I
Troponin I: 0.03
Troponin I: 0.03

## 2011-06-26 LAB — BASIC METABOLIC PANEL
BUN: 14
BUN: 16
BUN: 18
CO2: 29
Calcium: 8.5
Chloride: 107
Chloride: 107
Creatinine, Ser: 1.2
Creatinine, Ser: 1.38 — ABNORMAL HIGH
GFR calc Af Amer: 47 — ABNORMAL LOW
GFR calc non Af Amer: 38 — ABNORMAL LOW
GFR calc non Af Amer: 42 — ABNORMAL LOW
GFR calc non Af Amer: 44 — ABNORMAL LOW
Glucose, Bld: 118 — ABNORMAL HIGH
Glucose, Bld: 89
Glucose, Bld: 96
Potassium: 3.1 — ABNORMAL LOW
Potassium: 3.3 — ABNORMAL LOW
Potassium: 3.8
Potassium: 4.2
Sodium: 146 — ABNORMAL HIGH

## 2011-06-26 LAB — LIPID PANEL
Cholesterol: 126
Cholesterol: 134
HDL: 48
LDL Cholesterol: 70
LDL Cholesterol: 80
Total CHOL/HDL Ratio: 2.8
Triglycerides: 44
Triglycerides: 61
VLDL: 11

## 2011-06-26 LAB — I-STAT 8, (EC8 V) (CONVERTED LAB)
Acid-Base Excess: 2
Bicarbonate: 27 — ABNORMAL HIGH
Glucose, Bld: 121 — ABNORMAL HIGH
Hemoglobin: 12.9
Operator id: 161631
Potassium: 3.3 — ABNORMAL LOW
Sodium: 143
TCO2: 28

## 2011-06-26 LAB — DIFFERENTIAL
Lymphocytes Relative: 24
Lymphs Abs: 1.8
Monocytes Relative: 10
Neutrophils Relative %: 64

## 2011-06-26 LAB — PROTIME-INR
INR: 1.5
INR: 1.7 — ABNORMAL HIGH
Prothrombin Time: 18.7 — ABNORMAL HIGH
Prothrombin Time: 20.8 — ABNORMAL HIGH

## 2011-06-26 LAB — POCT I-STAT CREATININE
Creatinine, Ser: 1.5 — ABNORMAL HIGH
Operator id: 161631

## 2011-06-26 LAB — POCT CARDIAC MARKERS
Operator id: 161631
Troponin i, poc: <0.05

## 2011-06-26 LAB — CBC
HCT: 30.8 — ABNORMAL LOW
HCT: 32.1 — ABNORMAL LOW
HCT: 37.2
Hemoglobin: 10.7 — ABNORMAL LOW
Hemoglobin: 11.4 — ABNORMAL LOW
Hemoglobin: 12.1
MCHC: 33.4
MCV: 85.3
MCV: 85.6
Platelets: 212
RBC: 4.01
RDW: 15.3
RDW: 15.3
RDW: 15.4
WBC: 7.6

## 2011-06-26 LAB — MAGNESIUM: Magnesium: 2.3

## 2011-06-26 LAB — CK TOTAL AND CKMB (NOT AT ARMC): CK, MB: 1.5

## 2011-06-26 LAB — TSH: TSH: 3.983

## 2011-07-02 NOTE — Op Note (Signed)
  [name], [name]               ACCOUNT NO.:  [id number]  MEDICAL RECORD NO.:  [id number]  LOCATION:  3734                         FACILITY:  [facility]  PHYSICIAN:  [name]. Ladona Ridgel, MD    DATE OF BIRTH:  05/05/1933  DATE OF PROCEDURE:  05/14/2011 DATE OF DISCHARGE:                              OPERATIVE REPORT   PROCEDURE PERFORMED:  AV node ablation with His bundle mapping.  INTRODUCTION:  The patient is a 77 year old woman with a history of symptomatic tachy-brady syndrome.  She is status post pacemaker insertion.  She was subsequently found to have rapid ventricular response and AFib.  She is now referred for AV node ablation as she has been refractory to medical therapy.  DESCRIPTION OF PROCEDURE:  After informed consent was obtained, the patient was taken to the diagnostic EP lab in the fasting state.  After the usual preparation and draping, intravenous fentanyl and midazolam was given for sedation.  A 7-French quadripolar ablation catheter was inserted percutaneously into the right femoral vein and advanced into the His bundle region.  Mapping of the His bundle was carried out.  It was normal size and orientation.  A total of 3 RF energy applications were delivered to the His bundle region, resulting in creation of complete heart block.  The patient had a junctional escape rhythm of 35 beats per minute.  The patient was observed for 15 minutes and had no recurrent AV conduction.  The catheter was removed.  Hemostasis was assured.  She was returned to her room in satisfactory condition.  Her pacemaker was reprogrammed to VVI at 80 beats per minute.  COMPLICATIONS:  There were no immediate procedure complications.  RESULTS:  Demonstrate successful AV-node ablation in a patient with atrial fibrillation and rapid ventricular response.  The HV interval prior to ablation was 49 milliseconds.     [name]. Ladona Ridgel, MD     GWT/MEDQ  D:  05/14/2011  T:  05/14/2011  Job:   [id number]  Electronically Signed by Lewayne Bunting MD on 07/02/2011 06:39:56 PM

## 2011-07-03 LAB — COMPREHENSIVE METABOLIC PANEL
Albumin: 3.4 — ABNORMAL LOW
BUN: 22
Creatinine, Ser: 1.61 — ABNORMAL HIGH
Potassium: 3.4 — ABNORMAL LOW
Total Protein: 6.5

## 2011-07-03 LAB — CBC
HCT: 32.5 — ABNORMAL LOW
HCT: 33.4 — ABNORMAL LOW
HCT: 33.9 — ABNORMAL LOW
HCT: 34.2 — ABNORMAL LOW
HCT: 34.5 — ABNORMAL LOW
HCT: 34.8 — ABNORMAL LOW
HCT: 37.1
Hemoglobin: 11 — ABNORMAL LOW
Hemoglobin: 11.3 — ABNORMAL LOW
Hemoglobin: 11.5 — ABNORMAL LOW
Hemoglobin: 11.8 — ABNORMAL LOW
MCHC: 33.7
MCHC: 33.8
MCHC: 34.2
MCV: 82
MCV: 82.4
MCV: 82.8
MCV: 82.9
MCV: 82.9
Platelets: 221
Platelets: 244
RBC: 4.2
RBC: 4.48
RDW: 15.8 — ABNORMAL HIGH
RDW: 16.1 — ABNORMAL HIGH
RDW: 16.3 — ABNORMAL HIGH
WBC: 6.2
WBC: 6.5
WBC: 7.7

## 2011-07-03 LAB — PROTIME-INR
INR: 1
INR: 1.5
INR: 1.7 — ABNORMAL HIGH
INR: 2 — ABNORMAL HIGH
Prothrombin Time: 13.7
Prothrombin Time: 15.3 — ABNORMAL HIGH

## 2011-07-03 LAB — CK TOTAL AND CKMB (NOT AT ARMC)
CK, MB: 1
Total CK: 49

## 2011-07-03 LAB — LIPID PANEL: VLDL: 10

## 2011-07-03 LAB — BASIC METABOLIC PANEL
BUN: 23
BUN: 9
Chloride: 106
GFR calc Af Amer: 37 — ABNORMAL LOW
GFR calc non Af Amer: 31 — ABNORMAL LOW
GFR calc non Af Amer: 41 — ABNORMAL LOW
Glucose, Bld: 95
Potassium: 3.1 — ABNORMAL LOW
Potassium: 3.4 — ABNORMAL LOW
Sodium: 143

## 2011-07-03 LAB — APTT: aPTT: 30

## 2011-07-03 LAB — URINALYSIS, ROUTINE W REFLEX MICROSCOPIC
Ketones, ur: NEGATIVE
Nitrite: NEGATIVE
Specific Gravity, Urine: 1.016
pH: 6.5

## 2011-07-03 LAB — URINE MICROSCOPIC-ADD ON

## 2011-07-03 LAB — TROPONIN I: Troponin I: 0.03

## 2011-07-03 LAB — HEPARIN LEVEL (UNFRACTIONATED): Heparin Unfractionated: 0.53

## 2011-07-03 LAB — HEMOGLOBIN A1C: Mean Plasma Glucose: 129

## 2011-07-03 LAB — SEDIMENTATION RATE: Sed Rate: 26 — ABNORMAL HIGH

## 2011-07-06 LAB — CBC
HCT: 40.5
Hemoglobin: 13.6
MCHC: 33.5
MCV: 83.8
Platelets: 262
RBC: 4.36
RBC: 4.87
WBC: 7.9

## 2011-07-06 LAB — BASIC METABOLIC PANEL
CO2: 26
Chloride: 106
Creatinine, Ser: 1.4 — ABNORMAL HIGH
GFR calc Af Amer: 45 — ABNORMAL LOW

## 2011-07-06 LAB — COMPREHENSIVE METABOLIC PANEL
Albumin: 3.6
Alkaline Phosphatase: 67
BUN: 23
CO2: 31
Chloride: 103
GFR calc non Af Amer: 29 — ABNORMAL LOW
Glucose, Bld: 109 — ABNORMAL HIGH
Potassium: 3.7
Total Bilirubin: 0.7

## 2011-07-06 LAB — DIFFERENTIAL
Basophils Absolute: 0
Basophils Relative: 0
Monocytes Absolute: 0.8 — ABNORMAL HIGH
Neutro Abs: 4.6
Neutrophils Relative %: 58

## 2011-07-06 LAB — CARDIAC PANEL(CRET KIN+CKTOT+MB+TROPI)
Relative Index: INVALID
Total CK: 72

## 2011-07-06 LAB — PROTIME-INR: INR: 1

## 2011-07-22 ENCOUNTER — Encounter: Payer: Self-pay | Admitting: Internal Medicine

## 2011-07-22 ENCOUNTER — Ambulatory Visit (INDEPENDENT_AMBULATORY_CARE_PROVIDER_SITE_OTHER): Payer: Medicare FFS | Admitting: Internal Medicine

## 2011-07-22 DIAGNOSIS — I10 Essential (primary) hypertension: Secondary | ICD-10-CM

## 2011-07-22 DIAGNOSIS — I442 Atrioventricular block, complete: Secondary | ICD-10-CM

## 2011-07-22 DIAGNOSIS — I4891 Unspecified atrial fibrillation: Secondary | ICD-10-CM

## 2011-07-22 LAB — PACEMAKER DEVICE OBSERVATION
BRDY-0004RV: 110 {beats}/min
BRDY-0005RV: 70 {beats}/min
DEVICE MODEL PM: 7227567
RV LEAD THRESHOLD: 0.625 V

## 2011-07-22 NOTE — Progress Notes (Signed)
HPI [name] returns today for followup. She is a pleasant 77 yo woman with a h/o chronic atrial fib, HTN, s/p stroke, h/o mixed chronic CHF, and s/p PPM. She underwent AV node ablation several weeks ago and returns for followup. Previously her atrial fibrillation was not well-controlled despite medical therapy. Allergies  Allergen Reactions  . Amoxicillin   . Latex      Current Outpatient Prescriptions  Medication Sig Dispense Refill  . aspirin 81 MG tablet Take 81 mg by mouth daily.        . cetirizine (ZYRTEC) 10 MG tablet Take 10 mg by mouth daily.        Marland Kitchen donepezil (ARICEPT) 5 MG tablet Take 10 mg by mouth daily.       Marland Kitchen esomeprazole (NEXIUM) 40 MG capsule Take 40 mg by mouth daily before breakfast.        . ferrous sulfate 325 (65 FE) MG tablet Take 325 mg by mouth daily with breakfast.        . furosemide (LASIX) 20 MG tablet Take 20 mg by mouth as needed.       Marland Kitchen KLOR-CON M20 20 MEQ tablet Take 1 tablet by mouth as needed.       . latanoprost (XALATAN) 0.005 % ophthalmic solution Place 1 drop into both eyes at bedtime.       Marland Kitchen levothyroxine (SYNTHROID, LEVOTHROID) 50 MCG tablet Take 1 tablet by mouth Daily.      . meclizine (ANTIVERT) 25 MG tablet Take 25 mg by mouth 2 (two) times daily as needed.        . metoprolol tartrate (LOPRESSOR) 25 MG tablet Take 12.5 mg by mouth 2 (two) times daily.       . nitroGLYCERIN (NITROSTAT) 0.4 MG SL tablet Place 0.4 mg under the tongue every 5 (five) minutes as needed.        . promethazine (PHENERGAN) 25 MG tablet Take 25 mg by mouth as needed.        . sertraline (ZOLOFT) 50 MG tablet Take 50 mg by mouth daily.        Marland Kitchen warfarin (COUMADIN) 5 MG tablet Take 5 mg by mouth as directed.           Past Medical History  Diagnosis Date  . Atrial flutter     recurrent asymptomatic  . CAD (coronary artery disease)   . History of stroke   . Aortic dissection     contained  . HTN (hypertension)   . Ischemic heart disease   . MI  (myocardial infarction)   . GERD (gastroesophageal reflux disease)   . Depression     ROS:   All systems reviewed and negative except as noted in the HPI.   Past Surgical History  Procedure Date  . Echcardiogram 2007, 2008     Family History  Problem Relation Age of Onset  . Colon cancer Father   . Coronary artery disease Sister   . Stroke Father      History   Social History  . Marital Status: Widowed    Spouse Name: N/A    Number of Children: N/A  . Years of Education: N/A   Occupational History  . retired    Social History Main Topics  . Smoking status: Never Smoker   . Smokeless tobacco: Not on file  . Alcohol Use: No  . Drug Use: No  . Sexually Active: Not on file   Other Topics Concern  . Not  on file   Social History Narrative  . No narrative on file     BP 130/92  Pulse 80  Ht 5' 5.5" (1.664 m)  Wt 159 lb 12.8 oz (72.485 kg)  BMI 26.19 kg/m2  Physical Exam:  Well appearing NAD HEENT: Unremarkable Neck:  No JVD, no thyromegally Lymphatics:  No adenopathy Back:  No CVA tenderness Lungs:  Clear with no wheezes, rales, or rhonchi. Well-healed pacemaker incision. HEART:  Regular rate rhythm, no murmurs, no rubs, no clicks Abd:  soft, positive bowel sounds, no organomegally, no rebound, no guarding Ext:  2 plus pulses, no edema, no cyanosis, no clubbing Skin:  No rashes no nodules Neuro:  CN II through XII intact, motor grossly intact  DEVICE  Normal device function.  See PaceArt for details.   Assess/Plan:

## 2011-07-22 NOTE — Assessment & Plan Note (Signed)
Her blood pressure is slightly elevated today. I encouraged her to maintain a low-sodium diet. We'll plan to recheck in several months.

## 2011-07-22 NOTE — Assessment & Plan Note (Signed)
The patient appears to be much improved after AV node ablation. She has not required hospitalization for congestive heart failure. She denies palpitations. Her peripheral edema has improved. She will continue her current medical therapy.

## 2011-07-22 NOTE — Patient Instructions (Signed)
Your physician wants you to follow-up in: 6 months with Dr. Taylor. You will receive a reminder letter in the mail two months in advance. If you don't receive a letter, please call our office to schedule the follow-up appointment.  Your physician recommends that you continue on your current medications as directed. Please refer to the Current Medication list given to you today.  

## 2012-01-11 ENCOUNTER — Other Ambulatory Visit: Payer: Self-pay | Admitting: *Deleted

## 2012-01-11 DIAGNOSIS — I716 Thoracoabdominal aortic aneurysm, without rupture: Secondary | ICD-10-CM

## 2012-01-28 ENCOUNTER — Encounter: Payer: Self-pay | Admitting: Internal Medicine

## 2012-01-28 ENCOUNTER — Ambulatory Visit (INDEPENDENT_AMBULATORY_CARE_PROVIDER_SITE_OTHER): Payer: Medicare FFS | Admitting: Internal Medicine

## 2012-01-28 VITALS — BP 122/74 | HR 56 | Ht 65.5 in | Wt 156.8 lb

## 2012-01-28 DIAGNOSIS — I10 Essential (primary) hypertension: Secondary | ICD-10-CM

## 2012-01-28 DIAGNOSIS — I4891 Unspecified atrial fibrillation: Secondary | ICD-10-CM

## 2012-01-28 LAB — PACEMAKER DEVICE OBSERVATION
BATTERY VOLTAGE: 2.9779 V
BRDY-0002RV: 75 {beats}/min
BRDY-0004RV: 110 {beats}/min
BRDY-0005RV: 70 {beats}/min
DEVICE MODEL PM: 7227567
RV LEAD THRESHOLD: 0.875 V

## 2012-01-28 NOTE — Progress Notes (Signed)
HPI [name] returns today for followup. She is a very pleasant 78 year old woman with multiple medical problems including coronary disease, status post stroke, chronic atrial fibrillation, symptomatic tachybradycardia syndrome, status post pacemaker insertion. In the interim, she has been stable. She has had problems with falling and had to stop taking anticoagulation. She denies chest pain, shortness of breath, and her peripheral edema has been improved. No syncope. Allergies  Allergen Reactions  . Amoxicillin   . Latex      Current Outpatient Prescriptions  Medication Sig Dispense Refill  . aspirin 81 MG tablet Take 81 mg by mouth daily.        . cetirizine (ZYRTEC) 10 MG tablet Take 10 mg by mouth daily.        Marland Kitchen donepezil (ARICEPT) 5 MG tablet Take 10 mg by mouth daily.       Marland Kitchen esomeprazole (NEXIUM) 40 MG capsule Take 40 mg by mouth daily before breakfast.        . ferrous sulfate 325 (65 FE) MG tablet Take 325 mg by mouth daily with breakfast.        . furosemide (LASIX) 20 MG tablet Take 20 mg by mouth as needed.       Marland Kitchen HYDROcodone-acetaminophen (VICODIN) 5-500 MG per tablet as needed.       Marland Kitchen KLOR-CON M20 20 MEQ tablet Take 1 tablet by mouth as needed.       . latanoprost (XALATAN) 0.005 % ophthalmic solution Place 1 drop into both eyes at bedtime.       Marland Kitchen levothyroxine (SYNTHROID, LEVOTHROID) 50 MCG tablet Take 1 tablet by mouth Daily.      . meclizine (ANTIVERT) 25 MG tablet Take 25 mg by mouth 2 (two) times daily as needed.        . metoprolol tartrate (LOPRESSOR) 25 MG tablet Take 12.5 mg by mouth 2 (two) times daily.       . nitroGLYCERIN (NITROSTAT) 0.4 MG SL tablet Place 0.4 mg under the tongue every 5 (five) minutes as needed.        . promethazine (PHENERGAN) 25 MG tablet Take 25 mg by mouth as needed.        . sertraline (ZOLOFT) 50 MG tablet Take 50 mg by mouth daily.           Past Medical History  Diagnosis Date  . Atrial flutter     recurrent asymptomatic    . CAD (coronary artery disease)   . History of stroke   . Aortic dissection     contained  . HTN (hypertension)   . Ischemic heart disease   . MI (myocardial infarction)   . GERD (gastroesophageal reflux disease)   . Depression     ROS:   All systems reviewed and negative except as noted in the HPI.   Past Surgical History  Procedure Date  . Echcardiogram 2007, 2008     Family History  Problem Relation Age of Onset  . Colon cancer Father   . Coronary artery disease Sister   . Stroke Father      History   Social History  . Marital Status: Widowed    Spouse Name: N/A    Number of Children: N/A  . Years of Education: N/A   Occupational History  . retired    Social History Main Topics  . Smoking status: Never Smoker   . Smokeless tobacco: Not on file  . Alcohol Use: No  . Drug Use: No  .  Sexually Active: Not on file   Other Topics Concern  . Not on file   Social History Narrative  . No narrative on file     BP 122/74  Pulse 56  Ht 5' 5.5" (1.664 m)  Wt 156 lb 12.8 oz (71.124 kg)  BMI 25.70 kg/m2  Physical Exam:  Well appearing elderly woman, NAD HEENT: Unremarkable Neck:  No JVD, no thyromegally Lungs:  Clear with minimal rales in the bases. No wheezes or rhonchi. Well-healed but minimally tender pacemaker incision. HEART:  Regular rate rhythm, no murmurs, no rubs, no clicks Abd:  soft, positive bowel sounds, no organomegally, no rebound, no guarding Ext:  2 plus pulses, no edema, no cyanosis, no clubbing Skin:  No rashes no nodules Neuro:  CN II through XII intact, motor grossly intact  DEVICE  Normal device function.  See PaceArt for details.   Assess/Plan:

## 2012-01-28 NOTE — Assessment & Plan Note (Signed)
Her blood pressure appears to be well-controlled. She will continue her current medical therapy and maintain a low-sodium diet. 

## 2012-01-28 NOTE — Patient Instructions (Signed)
Your physician wants you to follow-up in: 6 months in device clinic and 12 months with Dr Taylor You will receive a reminder letter in the mail two months in advance. If you don't receive a letter, please call our office to schedule the follow-up appointment.  

## 2012-01-28 NOTE — Assessment & Plan Note (Signed)
Her symptoms appear to be well-controlled. Today her underlying ventricular rate is less than 40 beats a minute. She is pacing 99% of the time in the ventricle. She is not thought to be a Coumadin candidate or candidate for any systemic anticoagulation secondary to her propensity to fall.

## 2012-07-11 ENCOUNTER — Other Ambulatory Visit: Payer: Self-pay | Admitting: Vascular Surgery

## 2012-07-13 ENCOUNTER — Encounter: Payer: Self-pay | Admitting: Vascular Surgery

## 2012-07-14 ENCOUNTER — Ambulatory Visit
Admission: RE | Admit: 2012-07-14 | Discharge: 2012-07-14 | Disposition: A | Discharge status: Home / Self Care | Payer: Medicare FFS | Source: Ambulatory Visit | Attending: Vascular Surgery | Admitting: Vascular Surgery

## 2012-07-14 ENCOUNTER — Ambulatory Visit (INDEPENDENT_AMBULATORY_CARE_PROVIDER_SITE_OTHER): Payer: Medicare FFS | Admitting: Vascular Surgery

## 2012-07-14 ENCOUNTER — Encounter: Payer: Self-pay | Admitting: Vascular Surgery

## 2012-07-14 VITALS — BP 129/79 | HR 76 | Resp 18 | Ht 65.5 in | Wt 145.0 lb

## 2012-07-14 DIAGNOSIS — I716 Thoracoabdominal aortic aneurysm, without rupture: Secondary | ICD-10-CM

## 2012-07-14 DIAGNOSIS — I7100 Dissection of unspecified site of aorta: Secondary | ICD-10-CM | POA: Insufficient documentation

## 2012-07-14 DIAGNOSIS — I712 Thoracic aortic aneurysm, without rupture: Secondary | ICD-10-CM

## 2012-07-14 DIAGNOSIS — I7103 Dissection of thoracoabdominal aorta: Secondary | ICD-10-CM

## 2012-07-14 MED ORDER — IOHEXOL 350 MG/ML SOLN
80.0000 mL | Freq: Once | INTRAVENOUS | Status: AC | PRN
  Administered 2012-07-14: 80 mL via INTRAVENOUS

## 2012-07-14 NOTE — Progress Notes (Signed)
Patient is a 78 year old female returns today for followup for chronic aortic dissection. She was last seen in October of 2011. She has some mild dementia but otherwise no real new medical problems. She denies abdominal or back pain. She has chronic nausea and some mild anorexia. She continues to live independently but does have a caregiver that comes and helps her during the daytime.  Review of systems: She denies chest pain. She denies shortness of breath.  Past Medical History  Diagnosis Date  . Atrial flutter     recurrent asymptomatic  . CAD (coronary artery disease)   . History of stroke   . Aortic dissection     contained  . HTN (hypertension)   . Ischemic heart disease   . MI (myocardial infarction)   . GERD (gastroesophageal reflux disease)   . Depression   . Stroke   . Hyperlipidemia    Past Surgical History  Procedure Date  . Echcardiogram 2007, 2008   History   Social History  . Marital Status: Widowed    Spouse Name: N/A    Number of Children: N/A  . Years of Education: N/A   Occupational History  . retired    Social History Main Topics  . Smoking status: Never Smoker   . Smokeless tobacco: Never Used  . Alcohol Use: No  . Drug Use: No  . Sexually Active: None   Other Topics Concern  . None   Social History Narrative  . None   Family History  Problem Relation Age of Onset  . Colon cancer Father   . Coronary artery disease Sister   . Stroke Father     Physical exam: Filed Vitals:   07/14/12 1200  BP: 129/79  Pulse: 76  Resp: 18  Height: 5' 5.5" (1.664 m)  Weight: 145 lb (65.772 kg)  SpO2: 100%    Chest: Clear to auscultation bilaterally  Cardiac: Regular rate and rhythm without murmur  Neck: No carotid bruits  Abdomen: Pulsatile mass in epigastrium nontender  Extremities: No significant edema, 2+ femoral pulses  Neuro: Symmetric upper extremity and lower extremity motor strength which is 5 over 5  Data: CT angiogram of the  chest abdomen and pelvis was reviewed today. The aneurysm currently is in the descending thoracic upper abdomen and is 3.6 cm in size. He is a type II aortic dissection. It is essentially unchanged since her CT scan 2 years ago. It may be slightly larger by 2 mm.  Assessment: Stable type II aortic dissection 3.6 cm in diameter asymptomatic  Plan: Repeat CT Angio in one year  Fabienne Bruns, MD [facility] Office: [phone] Pager: [phone]

## 2012-07-14 NOTE — Addendum Note (Signed)
Addended by: Sharee Pimple on: 07/14/2012 02:22 PM   Modules accepted: Orders

## 2012-07-20 ENCOUNTER — Encounter: Payer: Self-pay | Admitting: *Deleted

## 2012-07-20 DIAGNOSIS — Z950 Presence of cardiac pacemaker: Secondary | ICD-10-CM | POA: Insufficient documentation

## 2012-07-22 DIAGNOSIS — I5032 Chronic diastolic (congestive) heart failure: Secondary | ICD-10-CM

## 2012-07-22 HISTORY — DX: Chronic diastolic (congestive) heart failure: I50.32

## 2012-07-23 ENCOUNTER — Inpatient Hospital Stay (HOSPITAL_COMMUNITY)
Admission: EM | Admit: 2012-07-23 | Discharge: 2012-07-25 | DRG: 280 | Disposition: A | Discharge status: Home / Self Care | Payer: Medicare FFS | Source: Other Acute Inpatient Hospital | Attending: Cardiology | Admitting: Cardiology

## 2012-07-23 ENCOUNTER — Encounter (HOSPITAL_COMMUNITY): Payer: Self-pay | Admitting: *Deleted

## 2012-07-23 DIAGNOSIS — Z23 Encounter for immunization: Secondary | ICD-10-CM

## 2012-07-23 DIAGNOSIS — I2510 Atherosclerotic heart disease of native coronary artery without angina pectoris: Secondary | ICD-10-CM | POA: Diagnosis present

## 2012-07-23 DIAGNOSIS — F3289 Other specified depressive episodes: Secondary | ICD-10-CM | POA: Diagnosis present

## 2012-07-23 DIAGNOSIS — Z8673 Personal history of transient ischemic attack (TIA), and cerebral infarction without residual deficits: Secondary | ICD-10-CM

## 2012-07-23 DIAGNOSIS — I7100 Dissection of unspecified site of aorta: Secondary | ICD-10-CM | POA: Diagnosis present

## 2012-07-23 DIAGNOSIS — I4892 Unspecified atrial flutter: Secondary | ICD-10-CM | POA: Diagnosis present

## 2012-07-23 DIAGNOSIS — I059 Rheumatic mitral valve disease, unspecified: Secondary | ICD-10-CM

## 2012-07-23 DIAGNOSIS — I252 Old myocardial infarction: Secondary | ICD-10-CM

## 2012-07-23 DIAGNOSIS — I5032 Chronic diastolic (congestive) heart failure: Secondary | ICD-10-CM | POA: Diagnosis present

## 2012-07-23 DIAGNOSIS — F329 Major depressive disorder, single episode, unspecified: Secondary | ICD-10-CM | POA: Diagnosis present

## 2012-07-23 DIAGNOSIS — Z79899 Other long term (current) drug therapy: Secondary | ICD-10-CM

## 2012-07-23 DIAGNOSIS — I4891 Unspecified atrial fibrillation: Secondary | ICD-10-CM | POA: Diagnosis present

## 2012-07-23 DIAGNOSIS — Z950 Presence of cardiac pacemaker: Secondary | ICD-10-CM | POA: Diagnosis present

## 2012-07-23 DIAGNOSIS — E785 Hyperlipidemia, unspecified: Secondary | ICD-10-CM | POA: Diagnosis present

## 2012-07-23 DIAGNOSIS — I509 Heart failure, unspecified: Secondary | ICD-10-CM | POA: Diagnosis present

## 2012-07-23 DIAGNOSIS — K219 Gastro-esophageal reflux disease without esophagitis: Secondary | ICD-10-CM | POA: Diagnosis present

## 2012-07-23 DIAGNOSIS — Z7982 Long term (current) use of aspirin: Secondary | ICD-10-CM

## 2012-07-23 DIAGNOSIS — I214 Non-ST elevation (NSTEMI) myocardial infarction: Principal | ICD-10-CM | POA: Diagnosis present

## 2012-07-23 DIAGNOSIS — R079 Chest pain, unspecified: Secondary | ICD-10-CM

## 2012-07-23 DIAGNOSIS — I10 Essential (primary) hypertension: Secondary | ICD-10-CM | POA: Diagnosis present

## 2012-07-23 HISTORY — DX: Presence of cardiac pacemaker: Z95.0

## 2012-07-23 HISTORY — DX: Chronic diastolic (congestive) heart failure: I50.32

## 2012-07-23 LAB — CBC WITH DIFFERENTIAL/PLATELET
Basophils Relative: 0 % (ref 0–1)
Eosinophils Absolute: 0.1 10*3/uL (ref 0.0–0.7)
Eosinophils Relative: 1 % (ref 0–5)
HCT: 35.3 % — ABNORMAL LOW (ref 36.0–46.0)
Hemoglobin: 11.6 g/dL — ABNORMAL LOW (ref 12.0–15.0)
MCH: 30 pg (ref 26.0–34.0)
MCHC: 32.9 g/dL (ref 30.0–36.0)
Monocytes Absolute: 0.8 10*3/uL (ref 0.1–1.0)
Monocytes Relative: 8 % (ref 3–12)
RDW: 16.3 % — ABNORMAL HIGH (ref 11.5–15.5)

## 2012-07-23 LAB — COMPREHENSIVE METABOLIC PANEL
Albumin: 3.3 g/dL — ABNORMAL LOW (ref 3.5–5.2)
BUN: 23 mg/dL (ref 6–23)
Calcium: 8.9 mg/dL (ref 8.4–10.5)
Creatinine, Ser: 0.94 mg/dL (ref 0.50–1.10)
Total Bilirubin: 0.6 mg/dL (ref 0.3–1.2)
Total Protein: 6.6 g/dL (ref 6.0–8.3)

## 2012-07-23 LAB — LIPID PANEL
Cholesterol: 100 mg/dL (ref 0–200)
HDL: 52 mg/dL (ref >39)
Total CHOL/HDL Ratio: 1.9 RATIO
Triglycerides: 51 mg/dL (ref <150)
VLDL: 10 mg/dL (ref 0–40)

## 2012-07-23 LAB — TROPONIN I
Troponin I: 0.41 ng/mL (ref <0.30)
Troponin I: 0.33 ng/mL (ref <0.30)

## 2012-07-23 MED ORDER — METOPROLOL TARTRATE 12.5 MG HALF TABLET
12.5000 mg | ORAL_TABLET | Freq: Two times a day (BID) | ORAL | Status: DC
  Administered 2012-07-23 – 2012-07-25 (×5): 12.5 mg via ORAL
  Filled 2012-07-23 (×8): qty 1

## 2012-07-23 MED ORDER — LATANOPROST 0.005 % OP SOLN
1.0000 [drp] | Freq: Every day | OPHTHALMIC | Status: DC
  Administered 2012-07-23 – 2012-07-24 (×2): 1 [drp] via OPHTHALMIC
  Filled 2012-07-23 (×3): qty 2.5

## 2012-07-23 MED ORDER — ASPIRIN 81 MG PO CHEW: 324.0000 mg | CHEWABLE_TABLET | ORAL | Status: DC

## 2012-07-23 MED ORDER — HEPARIN (PORCINE) IN NACL 100-0.45 UNIT/ML-% IJ SOLN
750.0000 [IU]/h | INTRAMUSCULAR | Status: DC
  Administered 2012-07-23: 750 [IU]/h via INTRAVENOUS
  Filled 2012-07-23: qty 250

## 2012-07-23 MED ORDER — LISINOPRIL 5 MG PO TABS
5.0000 mg | ORAL_TABLET | Freq: Every day | ORAL | Status: DC
  Administered 2012-07-23 – 2012-07-25 (×3): 5 mg via ORAL
  Filled 2012-07-23 (×4): qty 1

## 2012-07-23 MED ORDER — BRIMONIDINE TARTRATE 0.1 % OP SOLN: 1.0000 [drp] | Freq: Three times a day (TID) | OPHTHALMIC | Status: DC

## 2012-07-23 MED ORDER — SERTRALINE HCL 50 MG PO TABS
50.0000 mg | ORAL_TABLET | Freq: Every day | ORAL | Status: DC
  Administered 2012-07-23 – 2012-07-25 (×3): 50 mg via ORAL
  Filled 2012-07-23 (×4): qty 1

## 2012-07-23 MED ORDER — LEVOTHYROXINE SODIUM 50 MCG PO TABS
50.0000 ug | ORAL_TABLET | Freq: Every day | ORAL | Status: DC
  Administered 2012-07-24 – 2012-07-25 (×2): 50 ug via ORAL
  Filled 2012-07-23 (×4): qty 1

## 2012-07-23 MED ORDER — HEPARIN BOLUS VIA INFUSION
3500.0000 [IU] | Freq: Once | INTRAVENOUS | Status: AC
  Administered 2012-07-23: 3500 [IU] via INTRAVENOUS
  Filled 2012-07-23: qty 3500

## 2012-07-23 MED ORDER — INFLUENZA VIRUS VACC SPLIT PF IM SUSP
0.5000 mL | INTRAMUSCULAR | Status: AC
  Administered 2012-07-24: 0.5 mL via INTRAMUSCULAR
  Filled 2012-07-23: qty 0.5

## 2012-07-23 MED ORDER — PANTOPRAZOLE SODIUM 40 MG PO TBEC
40.0000 mg | DELAYED_RELEASE_TABLET | Freq: Every day | ORAL | Status: DC
  Administered 2012-07-23 – 2012-07-25 (×3): 40 mg via ORAL
  Filled 2012-07-23 (×2): qty 1

## 2012-07-23 MED ORDER — DONEPEZIL HCL 10 MG PO TABS
10.0000 mg | ORAL_TABLET | Freq: Every day | ORAL | Status: DC
  Administered 2012-07-23 – 2012-07-25 (×3): 10 mg via ORAL
  Filled 2012-07-23 (×4): qty 1

## 2012-07-23 MED ORDER — ONDANSETRON HCL 4 MG/2ML IJ SOLN: 4.0000 mg | Freq: Four times a day (QID) | INTRAMUSCULAR | Status: DC | PRN

## 2012-07-23 MED ORDER — CLOPIDOGREL BISULFATE 75 MG PO TABS
75.0000 mg | ORAL_TABLET | Freq: Every day | ORAL | Status: DC
  Administered 2012-07-24 – 2012-07-25 (×2): 75 mg via ORAL
  Filled 2012-07-23 (×2): qty 1

## 2012-07-23 MED ORDER — ASPIRIN 300 MG RE SUPP
300.0000 mg | RECTAL | Status: DC
  Filled 2012-07-23: qty 1

## 2012-07-23 MED ORDER — ACETAMINOPHEN 325 MG PO TABS: 650.0000 mg | ORAL_TABLET | ORAL | Status: DC | PRN

## 2012-07-23 MED ORDER — NITROGLYCERIN 0.4 MG SL SUBL: 0.4000 mg | SUBLINGUAL_TABLET | SUBLINGUAL | Status: DC | PRN

## 2012-07-23 MED ORDER — ASPIRIN EC 81 MG PO TBEC
81.0000 mg | DELAYED_RELEASE_TABLET | Freq: Every day | ORAL | Status: DC
  Administered 2012-07-24 – 2012-07-25 (×2): 81 mg via ORAL
  Filled 2012-07-23 (×2): qty 1

## 2012-07-23 MED ORDER — CLOPIDOGREL BISULFATE 75 MG PO TABS
300.0000 mg | ORAL_TABLET | Freq: Once | ORAL | Status: AC
  Administered 2012-07-23: 300 mg via ORAL
  Filled 2012-07-23: qty 4

## 2012-07-23 MED ORDER — BRIMONIDINE TARTRATE 0.2 % OP SOLN
1.0000 [drp] | Freq: Three times a day (TID) | OPHTHALMIC | Status: DC
  Administered 2012-07-23 – 2012-07-25 (×7): 1 [drp] via OPHTHALMIC
  Filled 2012-07-23 (×3): qty 5

## 2012-07-23 MED ORDER — ATORVASTATIN CALCIUM 40 MG PO TABS
40.0000 mg | ORAL_TABLET | Freq: Every day | ORAL | Status: DC
  Administered 2012-07-24: 40 mg via ORAL
  Filled 2012-07-23 (×4): qty 1

## 2012-07-23 NOTE — Progress Notes (Signed)
  Echocardiogram 2D Echocardiogram has been performed.  Jaiden Dinkins 07/23/2012, 12:29 PM

## 2012-07-23 NOTE — Progress Notes (Signed)
INITIAL ADULT NUTRITION ASSESSMENT Date: 07/23/2012   Time: 3:11 PM  Reason for Assessment: Nutrition Risk report (MST=3)  INTERVENTION: RD to monitor adequacy of oral intake and follow-up to add supplements as needed.  DOCUMENTATION CODES Per approved criteria  -Not Applicable   ASSESSMENT: Female 78 y.o.  Dx: sudden onset upper chest sharp pain occurring while at rest that radiated to both shoulders. This was associated with nausea and vomiting.  Hx:  Past Medical History  Diagnosis Date  . Atrial flutter     recurrent asymptomatic  . CAD (coronary artery disease)   . History of stroke   . Aortic dissection     contained  . HTN (hypertension)   . Ischemic heart disease   . MI (myocardial infarction)   . GERD (gastroesophageal reflux disease)   . Depression   . Stroke   . Hyperlipidemia   . Pacemaker     Past Surgical History  Procedure Date  . Echcardiogram 2007, 2008  . Appendectomy   . Abdominal hysterectomy   . Joint replacement     complete RT knee    Related Meds:  Scheduled Meds:   . aspirin EC  81 mg Oral Daily  . atorvastatin  40 mg Oral q1800  . brimonidine  1 drop Both Eyes Q8H  . clopidogrel  300 mg Oral Once  . clopidogrel  75 mg Oral Q breakfast  . donepezil  10 mg Oral Daily  . heparin  3,500 Units Intravenous Once  . influenza  inactive virus vaccine  0.5 mL Intramuscular Tomorrow-1000  . latanoprost  1 drop Both Eyes QHS  . levothyroxine  50 mcg Oral QAC breakfast  . lisinopril  5 mg Oral Daily  . metoprolol tartrate  12.5 mg Oral BID  . pantoprazole  40 mg Oral Daily  . sertraline  50 mg Oral Daily  . DISCONTD: aspirin  324 mg Oral NOW  . DISCONTD: aspirin  300 mg Rectal NOW  . DISCONTD: brimonidine  1 drop Both Eyes Q8H   Continuous Infusions:   . DISCONTD: heparin 750 Units/hr (07/23/12 0935)   PRN Meds:.acetaminophen, nitroGLYCERIN, ondansetron (ZOFRAN) IV   Ht: 5' 5.5" (166.4 cm)  Wt: 141 lb 9.6 oz (64.229 kg)  Ideal  Wt: 58 kg % Ideal Wt: 111%  Wt Readings from Last 10 Encounters:  07/23/12 141 lb 9.6 oz (64.229 kg)  07/14/12 145 lb (65.772 kg)  01/28/12 156 lb 12.8 oz (71.124 kg)  07/22/11 159 lb 12.8 oz (72.485 kg)  05/05/11 165 lb (74.844 kg)  08/04/10 143 lb (64.864 kg)  01/27/10 134 lb (60.782 kg)  07/25/09 140 lb (63.504 kg)  12/06/08 170 lb (77.111 kg)   Usual Wt: 156 lb % Usual Wt: 90%  Body mass index is 23.20 kg/(m^2).  Food/Nutrition Related Hx: 10% weight loss in the past 6 months  Labs:  CMP     Component Value Date/Time   NA 141 07/23/2012 0921   K 4.0 07/23/2012 0921   CL 105 07/23/2012 0921   CO2 25 07/23/2012 0921   GLUCOSE 103* 07/23/2012 0921   BUN 23 07/23/2012 0921   CREATININE 0.94 07/23/2012 0921   CREATININE 1.16* 07/11/2012 1247   CALCIUM 8.9 07/23/2012 0921   PROT 6.6 07/23/2012 0921   ALBUMIN 3.3* 07/23/2012 0921   AST 23 07/23/2012 0921   ALT 13 07/23/2012 0921   ALKPHOS 95 07/23/2012 0921   BILITOT 0.6 07/23/2012 0921   GFRNONAA 57* 07/23/2012 0921   GFRAA  66* 07/23/2012 0921     No intake or output data in the 24 hours ending 07/23/12 1515   Diet Order: Heart Healthy   IVF:    DISCONTD: heparin Last Rate: 750 Units/hr (07/23/12 0935)    Estimated Nutritional Needs:   Kcal: 1500-1700 Protein: 75-90 gm Fluid: 1.5-1.6 liters  Patient reports 30 lb weight loss in the past 6 months without trying.  This is a significant 10% loss of usual weight.  Current weight is WNL.  Says she has been eating very well.  Consumed almost all of lunch today.  Patient declines supplements and snacks at this time, thinks she is eating well enough.  NUTRITION DIAGNOSIS: Unintentional weight loss related to unknown cause as evidenced by 10% weight loss in 6 months.  MONITORING/EVALUATION(Goals): Goal:  Intake to meet >90% of estimated nutrition needs. Monitor:  PO intake, weight trend, labs, I/O  EDUCATION NEEDS: -No education needs identified at this time   Joaquin Courts, RD, LDN, CNSC Pager# [phone] After Hours Pager# [phone]  07/23/2012, 3:11 PM

## 2012-07-23 NOTE — Progress Notes (Signed)
CRITICAL VALUE ALERT  Critical value received:  Trop 0.62    Date of notification:  07/23/12  Time of notification:  0930  Critical value read back: yes  Nurse who received alert:  Candace Cruise, RN  MD notified (1st page):  Dr. Jens Som (verbal notification)  Time of first page:  0945  MD notified (2nd page):  Time of second page:  Responding MD:  Dr. Jens Som  Time MD responded:  [phone]

## 2012-07-23 NOTE — Progress Notes (Signed)
ANTICOAGULATION CONSULT NOTE - Initial Consult  Pharmacy Consult for Heparin Indication: chest pain/ACS, NSTEMI  Allergies  Allergen Reactions  . Amoxicillin     Unknown reaction  . Chocolate Nausea And Vomiting and Other (See Comments)    headache  . Latex Other (See Comments)    Blistering and skin loss    Patient Measurements: Height: 5' 5.5" (166.4 cm) Weight: 141 lb 9.6 oz (64.229 kg) IBW/kg (Calculated) : 58.15  Heparin Dosing Weight: 64.2 kg  Vital Signs: Temp: 97.6 F (36.4 C) (11/02 0600) Temp src: Oral (11/02 0600) BP: 124/73 mmHg (11/02 0600) Pulse Rate: 75  (11/02 0600)  Labs: No results found for this basename: HGB:2,HCT:3,PLT:3,APTT:3,LABPROT:3,INR:3,HEPARINUNFRC:3,CREATININE:3,CKTOTAL:3,CKMB:3,TROPONINI:3 in the last 72 hours  Estimated Creatinine Clearance: 37.3 ml/min (by C-G formula based on Cr of 1.16).   Medical History: Past Medical History  Diagnosis Date  . Atrial flutter     recurrent asymptomatic  . CAD (coronary artery disease)   . History of stroke   . Aortic dissection     contained  . HTN (hypertension)   . Ischemic heart disease   . MI (myocardial infarction)   . GERD (gastroesophageal reflux disease)   . Depression   . Stroke   . Hyperlipidemia     Medications:  Prescriptions prior to admission  Medication Sig Dispense Refill  . ALPHAGAN P 0.1 % SOLN Place 1 drop into both eyes every 8 (eight) hours.       Marland Kitchen aspirin 81 MG tablet Take 81 mg by mouth daily.        . cetirizine (ZYRTEC) 10 MG tablet Take 10 mg by mouth daily.        Marland Kitchen esomeprazole (NEXIUM) 40 MG capsule Take 40 mg by mouth daily before breakfast.        . furosemide (LASIX) 20 MG tablet Take 20 mg by mouth daily as needed.       Marland Kitchen HYDROcodone-acetaminophen (VICODIN) 5-500 MG per tablet Take 1 tablet by mouth every 6 (six) hours as needed. pain      . KLOR-CON M20 20 MEQ tablet Take 1 tablet by mouth daily as needed. Only takes when taking fluid pill      .  latanoprost (XALATAN) 0.005 % ophthalmic solution Place 1 drop into both eyes at bedtime.       Marland Kitchen levothyroxine (SYNTHROID, LEVOTHROID) 50 MCG tablet Take 1 tablet by mouth Daily.      Marland Kitchen lisinopril (PRINIVIL,ZESTRIL) 5 MG tablet Take 5 mg by mouth daily.       . meclizine (ANTIVERT) 25 MG tablet Take 25 mg by mouth 2 (two) times daily as needed. For vertigo      . metoprolol tartrate (LOPRESSOR) 25 MG tablet Take 12.5 mg by mouth 2 (two) times daily.       . promethazine (PHENERGAN) 25 MG tablet Take 25 mg by mouth every 6 (six) hours as needed. nausea      . sertraline (ZOLOFT) 50 MG tablet Take 50 mg by mouth daily.        Marland Kitchen donepezil (ARICEPT) 5 MG tablet Take 10 mg by mouth daily.       . nitroGLYCERIN (NITROSTAT) 0.4 MG SL tablet Place 0.4 mg under the tongue every 5 (five) minutes as needed. For chest pain        Assessment: 78 y/o female with chest pain, N/V, likely NSTEMI.  CrCl ~37 ml/min. Heparin dosing weight: 64.2 kg  Goal of Therapy:  Heparin level 0.3-0.7 units/ml  Monitor platelets by anticoagulation protocol: Yes   Plan:  1. Bolus heparin 14782 units 2. Start heparin drip 750 units/her 3. 8 h heparin level @ 1600  4. Daily CBC, Heparin level    Doris Cheadle, PharmD Clinical Pharmacist Pager: [phone] Phone: [phone] 07/23/2012 8:11 AM

## 2012-07-23 NOTE — H&P (Addendum)
Patient ID: [name] MRN: [id number], DOB/AGE: 1933-02-04   Admit date: 07/23/2012   Primary Physician: Jerene Pitch, MD Primary Cardiologist: Dr. Ladona Ridgel ()  Pt. Profile:   Problem List  Past Medical History  Diagnosis Date  . Atrial flutter     recurrent asymptomatic  . CAD (coronary artery disease)   . History of stroke   . Aortic dissection     contained  . HTN (hypertension)   . Ischemic heart disease   . MI (myocardial infarction)   . GERD (gastroesophageal reflux disease)   . Depression   . Stroke   . Hyperlipidemia     Past Surgical History  Procedure Date  . Echcardiogram 2007, 2008     Allergies  Allergies  Allergen Reactions  . Amoxicillin     Unknown reaction  . Chocolate Nausea And Vomiting and Other (See Comments)    headache  . Latex Other (See Comments)    Blistering and skin loss    HPI  History obtained from patient and patient's sister [name] (phone # [phone])  78 y/o female with h/o as above including CAD (s/p stent ~5 yrs ago at [facility] per patient) and chronic type B dissection followed by Dr. Darrick Penna who p/w chest pain.  She describes sudden onset upper chest sharp pain occurring while at rest that radiated to both shoulders.  This was associated with nausea and vomiting.  It lasted ~1 hour.  She received NTG paste and aspirin and was transferred to [facility] from [facility].  Denies dyspnea, palpitations, syncope.  Home Medications  Prior to Admission medications   Medication Sig Start Date End Date Taking? Authorizing Provider  ALPHAGAN P 0.1 % SOLN Place 1 drop into both eyes every 8 (eight) hours.  05/16/12  Yes Historical Provider, MD  aspirin 81 MG tablet Take 81 mg by mouth daily.     Yes Historical Provider, MD  cetirizine (ZYRTEC) 10 MG tablet Take 10 mg by mouth daily.     Yes Historical Provider, MD  esomeprazole (NEXIUM) 40 MG capsule Take 40 mg by mouth daily before breakfast.     Yes Historical  Provider, MD  furosemide (LASIX) 20 MG tablet Take 20 mg by mouth daily as needed.    Yes Historical Provider, MD  HYDROcodone-acetaminophen (VICODIN) 5-500 MG per tablet Take 1 tablet by mouth every 6 (six) hours as needed. pain 12/09/11  Yes Historical Provider, MD  KLOR-CON M20 20 MEQ tablet Take 1 tablet by mouth daily as needed. Only takes when taking fluid pill 04/25/11  Yes Historical Provider, MD  latanoprost (XALATAN) 0.005 % ophthalmic solution Place 1 drop into both eyes at bedtime.    Yes Historical Provider, MD  levothyroxine (SYNTHROID, LEVOTHROID) 50 MCG tablet Take 1 tablet by mouth Daily. 04/24/11  Yes Historical Provider, MD  lisinopril (PRINIVIL,ZESTRIL) 5 MG tablet Take 5 mg by mouth daily.  06/08/12  Yes Historical Provider, MD  meclizine (ANTIVERT) 25 MG tablet Take 25 mg by mouth 2 (two) times daily as needed. For vertigo   Yes Historical Provider, MD  metoprolol tartrate (LOPRESSOR) 25 MG tablet Take 12.5 mg by mouth 2 (two) times daily.    Yes Historical Provider, MD  promethazine (PHENERGAN) 25 MG tablet Take 25 mg by mouth every 6 (six) hours as needed. nausea   Yes Historical Provider, MD  sertraline (ZOLOFT) 50 MG tablet Take 50 mg by mouth daily.     Yes Historical Provider, MD  donepezil (  ARICEPT) 5 MG tablet Take 10 mg by mouth daily.     Historical Provider, MD  nitroGLYCERIN (NITROSTAT) 0.4 MG SL tablet Place 0.4 mg under the tongue every 5 (five) minutes as needed. For chest pain    Historical Provider, MD    Family History  Family History  Problem Relation Age of Onset  . Colon cancer Father   . Coronary artery disease Sister   . Stroke Father     Social History  History   Social History  . Marital Status: Widowed    Spouse Name: N/A    Number of Children: N/A  . Years of Education: N/A   Occupational History  . retired    Social History Main Topics  . Smoking status: Never Smoker   . Smokeless tobacco: Never Used  . Alcohol Use: No  . Drug Use:  No  . Sexually Active: Not on file   Other Topics Concern  . Not on file   Social History Narrative  . No narrative on file     Review of Systems  All other systems reviewed and are otherwise negative except as noted above. Denies bleeding, melena, etc.  She does have occasional falls.  Physical Exam  Blood pressure 124/73, pulse 75, temperature 97.6 F (36.4 C), temperature source Oral, height 5' 5.5" (1.664 m), weight 141 lb 9.6 oz (64.229 kg), SpO2 98.00%.  General: Pleasant, NAD Psych: Normal affect, obvious memory deficits Neuro: Alert and oriented X 3. Moves all extremities spontaneously. HEENT: Normal  Neck: Supple without bruits or JVD. Lungs:  Resp regular and unlabored, CTA. Heart: RRR. Persistent splitting of S2; no s3, s4, or murmurs. Abdomen: Soft, tender to palpation in mid abdominal region Extremities: No clubbing, cyanosis or edema. DP/PT/Radials 2+ and equal bilaterally.  Labs  Troponin at OSH was elevated at 0.867 (CK/CKMB negative).  No results found for this basename: CKTOTAL:4,CKMB:4,TROPONINI:4 in the last 72 hours Lab Results  Component Value Date   WBC 7.0 05/05/2011   HGB 10.6* 05/05/2011   HCT 33.0* 05/05/2011   MCV 81.9 05/05/2011   PLT 241.0 05/05/2011   No results found for this basename: NA,K,CL,CO2,BUN,CREATININE,CALCIUM,LABALBU,PROT,BILITOT,ALKPHOS,ALT,AST,GLUCOSE in the last 168 hours Lab Results  Component Value Date   CHOL  Value: 94        ATP III CLASSIFICATION:  <200     mg/dL   Desirable  119-147  mg/dL   Borderline High  >=829    mg/dL   High        5/62/1308   HDL 43 01/16/2011   LDLCALC  Value: 45        Total Cholesterol/HDL:CHD Risk Coronary Heart Disease Risk Table                     Men   Women  1/2 Average Risk   3.4   3.3  Average Risk       5.0   4.4  2 X Average Risk   9.6   7.1  3 X Average Risk  23.4   11.0        Use the calculated Patient Ratio above and the CHD Risk Table to determine the patient's CHD Risk.        ATP  III CLASSIFICATION (LDL):  <100     mg/dL   Optimal  657-846  mg/dL   Near or Above  Optimal  130-159  mg/dL   Borderline  409-811  mg/dL   High  >914     mg/dL   Very High 7/82/9562   TRIG 32 01/16/2011   No results found for this basename: DDIMER     Radiology/Studies A CTA was performed prior to her transfer (disc is in her chart here) which showed no change from prior study done at that hospital (which was ~2 years ago) and no overall change from her recent CTA done here based on the radiology interpretations.  There was no type A dissection.  No significant intra-abdominal pathologic findings.  There was a lung nodule mentioned as well as some mild interstitial pulmonary edema.  ECG  A Fib, V paced  ASSESSMENT AND PLAN  1) Chest pain: clinical context suggests NSTEMI given elevated cardiac biomarkers.  Will continue aspirin, start Lipitor 40 mg daily, Plavix load, and place on UFH gtt (will choose over Lovenox given advanced age, elevated creatinine, and mildly low hemoglobin of unknown etiology).  Given comorbidities (obvious dementia included), it may be best to manage her conservatively and reserve cath for persistent angina, low EF, etc.).  Will check TTE, lipids, place on tele, cycle troponins  2) Type B dissection- seems stable.  The images from OSH are on disc in her chart.  3) Abdominal pain- unclear if chronic or not as patient gives mixed answers.  Will check LFTs and lipase.  CT abdomen did not show any obvious cause.  4. A Fib- not on AC due to fall risk; rate-controlled  5. Proph- UFH gtt; continue home PPI given DAPT and UFH for GI prophylaxis  Full Code   Signed, PLITT,DAVID, MD 07/23/2012, 7:31 AM Above reviewed; patient presently without chest pain; given h/o aortic dissection, I am hesitant to fully anticoagulate; continue ASA and plavix and DC heparin; best option if she remains pain free may be functional study and then medical therapy if normal  or low risk; any intervention would require anticoagulation which would be problematic with aortic dissection. Arlys John Ainsleigh Kakos 11:10 AM

## 2012-07-24 DIAGNOSIS — I214 Non-ST elevation (NSTEMI) myocardial infarction: Principal | ICD-10-CM

## 2012-07-24 LAB — CBC
Hemoglobin: 12.4 g/dL (ref 12.0–15.0)
Platelets: 144 10*3/uL — ABNORMAL LOW (ref 150–400)
RBC: 4.18 MIL/uL (ref 3.87–5.11)
WBC: 6.1 10*3/uL (ref 4.0–10.5)

## 2012-07-24 LAB — HEPARIN LEVEL (UNFRACTIONATED): Heparin Unfractionated: <0.1 IU/mL — ABNORMAL LOW (ref 0.30–0.70)

## 2012-07-24 NOTE — Progress Notes (Signed)
   Subjective:  Denies CP or dyspnea   Objective:  Filed Vitals:   07/23/12 2210 07/23/12 2310 07/24/12 0600 07/24/12 0947  BP: 158/106 138/90 149/88 99/64  Pulse: 79  75 80  Temp:   97.6 F (36.4 C)   TempSrc:   Oral   Resp:   20   Height:      Weight:      SpO2:   97%     Intake/Output from previous day:  Intake/Output Summary (Last 24 hours) at 07/24/12 1040 Last data filed at 07/24/12 0551  Gross per 24 hour  Intake    100 ml  Output    450 ml  Net   -350 ml    Physical Exam: Physical exam: Well-developed well-nourished in no acute distress.  Skin is warm and dry.  HEENT is normal.  Neck is supple. Chest is clear to auscultation with normal expansion.  Cardiovascular exam is regular rate and rhythm.  Abdominal exam nontender or distended. No masses palpated. Extremities show no edema. neuro grossly intact    Lab Results: Basic Metabolic Panel:  [facility] 07/23/12 0921  NA 141  K 4.0  CL 105  CO2 25  GLUCOSE 103*  BUN 23  CREATININE 0.94  CALCIUM 8.9  MG --  PHOS --   CBC:  Basename 07/24/12 0625 07/23/12 0921  WBC 6.1 9.8  NEUTROABS -- 7.6  HGB 12.4 11.6*  HCT 37.8 35.3*  MCV 90.4 91.2  PLT 144* 171   Cardiac Enzymes:  Basename 07/23/12 2140 07/23/12 1535 07/23/12 0925  CKTOTAL -- -- --  CKMB -- -- --  CKMBINDEX -- -- --  TROPONINI 0.33* 0.41* 0.62*     Assessment/Plan:  1 NSTEMI - continue ASA, plavix, BB and statin; no heparin given h/o aortic dissection; Will review with Dr Ladona Ridgel in AM; given h/o aortic dissection, best option may be medical therapy; alternatively, could pursue lexiscan myoview and reserve cath (would need to be radial approach) for high risk study. 2 Chronic aortic dissection ( beginning at diaphragmatic hiatus). 3 Atrial fibrillation - continue beta blocker and ASA; not on coumadin given fall risk and aortic dissection.  Olga Millers 07/24/2012, 10:40 AM

## 2012-07-25 ENCOUNTER — Inpatient Hospital Stay (HOSPITAL_COMMUNITY): Payer: Medicare FFS

## 2012-07-25 ENCOUNTER — Telehealth: Payer: Self-pay | Admitting: Internal Medicine

## 2012-07-25 ENCOUNTER — Encounter (HOSPITAL_COMMUNITY): Payer: Self-pay | Admitting: Physician Assistant

## 2012-07-25 DIAGNOSIS — K219 Gastro-esophageal reflux disease without esophagitis: Secondary | ICD-10-CM

## 2012-07-25 DIAGNOSIS — R079 Chest pain, unspecified: Secondary | ICD-10-CM

## 2012-07-25 DIAGNOSIS — I214 Non-ST elevation (NSTEMI) myocardial infarction: Secondary | ICD-10-CM

## 2012-07-25 DIAGNOSIS — I5032 Chronic diastolic (congestive) heart failure: Secondary | ICD-10-CM

## 2012-07-25 LAB — CBC
HCT: 35.6 % — ABNORMAL LOW (ref 36.0–46.0)
Hemoglobin: 11.6 g/dL — ABNORMAL LOW (ref 12.0–15.0)
WBC: 5.5 10*3/uL (ref 4.0–10.5)

## 2012-07-25 LAB — HEPARIN LEVEL (UNFRACTIONATED): Heparin Unfractionated: <0.1 IU/mL — ABNORMAL LOW (ref 0.30–0.70)

## 2012-07-25 MED ORDER — PANTOPRAZOLE SODIUM 40 MG PO TBEC: 40.0000 mg | DELAYED_RELEASE_TABLET | Freq: Every day | ORAL | Status: DC

## 2012-07-25 MED ORDER — TECHNETIUM TC 99M SESTAMIBI GENERIC - CARDIOLITE
30.0000 | Freq: Once | INTRAVENOUS | Status: AC | PRN
  Administered 2012-07-25: 30 via INTRAVENOUS

## 2012-07-25 MED ORDER — REGADENOSON 0.4 MG/5ML IV SOLN
0.4000 mg | Freq: Once | INTRAVENOUS | Status: AC
  Administered 2012-07-25: 0.4 mg via INTRAVENOUS
  Filled 2012-07-25: qty 5

## 2012-07-25 MED ORDER — TECHNETIUM TC 99M SESTAMIBI GENERIC - CARDIOLITE
10.0000 | Freq: Once | INTRAVENOUS | Status: AC | PRN
  Administered 2012-07-25: 10 via INTRAVENOUS

## 2012-07-25 MED ORDER — CLOPIDOGREL BISULFATE 75 MG PO TABS: 75.0000 mg | ORAL_TABLET | Freq: Every day | ORAL | Status: DC

## 2012-07-25 NOTE — Discharge Summary (Signed)
Discharge Summary   Patient ID: [name],  MRN: [id number], DOB/AGE: 1933/01/03 78 y.o.  Admit date: 07/23/2012 Discharge date: 07/25/2012  Primary Physician: Jerene Pitch, MD Primary Cardiologist: Lewayne Bunting, MD (EP)  Discharge Diagnoses Principal Problem:  *NSTEMI (non-ST elevated myocardial infarction) Active Problems:  Essential hypertension, benign  Atrial fibrillation  Aortic dissection  Pacemaker-St.Jude  Chronic diastolic CHF (congestive heart failure)  GERD (gastroesophageal reflux disease)   Allergies Allergies  Allergen Reactions  . Amoxicillin     Unknown reaction  . Chocolate Nausea And Vomiting and Other (See Comments)    headache  . Latex Other (See Comments)    Blistering and skin loss    Diagnostic Studies/Procedures  2D ECHO - 07/23/12  - Left ventricle: The cavity size was normal. Wall thickness was normal. Systolic function was normal. The estimated ejection fraction was in the range of 50% to 55%. Cannot exclude hypokinesis of the distalinferoseptal myocardium. Features are consistent with a pseudonormal left ventricular filling pattern, with concomitant abnormal relaxation and increased filling pressure (grade 2 diastolic dysfunction). Doppler parameters are consistent with elevated ventricular end-diastolic filling pressure. - Aortic valve: Trivial regurgitation. - Mitral valve: Mildly thickened leaflets . Mild regurgitation directed posteriorly. - Left atrium: The atrium was mildly dilated. - Tricuspid valve: Mild regurgitation. - Pulmonary arteries: PA peak pressure: 37mm Hg (S). - Pericardium, extracardiac: There was no pericardial effusion.  LEXISCAN MYOVIEW- 07/25/12  Final interpretation: Low risk Lexiscan Myoview with no chest pain  and an uninterpretable electrocardiogram due to ventricular pacing.  The scintigraphic results show fixed defects in the septum and  lateral wall. This may be related to ventricular  pacing or soft  tissue attenuation. Prior infarction cannot be excluded. There is  no significant ischemia. The gated ejection fraction was 58% and  the wall motion was normal.  History of Present Illness  [name] is a 78yo female hospitalized at [facility] with the above problem list. She has a history of stable type B dissection followed by Dr. Darrick Penna. She has a history of atrial fib/flutter - not on anticoagulation d/t fall risk. She experienced sudden, sharp upper chest pain at rest radiating to both of her shoulders associated with nausea and vomiting lasting ~1 hour the date of admission. She initially presented to [facility], received NTG paste and was transferred to [name]. Upon arrival to the ED, EKG revealed no ischemic changes, however initial trop-I returned mildly elevated. Given that she ruled in and her symptoms were consistent with cardiac ischemia, she was initially heparinized and placed in observation. She was Plavix loaded and statin was added. ASA and BB were continued.   Hospital Course   The patient was re-evaluated shortly after admission by Dr. Jens Som. She remained stable. Given her h/o aortic dissection, the decision was made to d/c heparin and continue Plavix. She did endorse mild abdominal pain on admission. LFTs returned WNL aside from mild hypoalbuminemia. This resolved. Lipid panel returned revealing good lipid control (LDL 38). Cardiac biomarkers were cycled revealing a mildly elevated initial troponin with subsequent downtrend (0.62>0.41>0.33). 2D echo as above revealed LVEF 50-55%, grade 2 dd, mild MR/TR, mild LA dilatation, PASP 37 mmHg. She was set up with a Lexiscan Myoview for further ischemic evaluation given her multiple comorbities. It was noted that medical therapy should be pursued if the study was normal or low-risk.   As above, this was a low-risk study with fixed septal and lateral defects thought to be related to ventricular  pacing or soft  tissue attenuation, however prior infarct could no be excluded. LVEF 58%. Of note, during the study, underlying atrial flutter vs course a-fib was appreciated on EKG.   She remained stable and was deemed appropriate for discharge. She will be discharged on the medications below. Statin will be discontinued given LDL 38 this admission off of statin therapy. After discussing with Dr. Eden Emms, she will continue ASA/Plavix for added anticoagulation benefit given underlying atrial fibrillation/flutter, and given that her underlying type B aortic dissection is stable. PPI was switched to Protonix. She will follow-up in </= 7 days given her NSTEMI status. This information is clearly outlined in the discharge AVS.   Discharge Vitals:  Blood pressure 143/89, pulse 86, temperature 97.6 F (36.4 C), temperature source Oral, resp. rate 17, height 5' 5.5" (1.664 m), weight 64.229 kg (141 lb 9.6 oz), SpO2 96.00%.   Labs: Recent Labs  [name] 07/25/12 0630 07/24/12 0625   WBC 5.5 6.1   HGB 11.6* 12.4   HCT 35.6* 37.8   MCV 90.4 90.4   PLT 187 144*    Lab 07/23/12 0921  NA 141  K 4.0  CL 105  CO2 25  BUN 23  CREATININE 0.94  CALCIUM 8.9  PROT 6.6  BILITOT 0.6  ALKPHOS 95  ALT 13  AST 23  AMYLASE --  LIPASE 34  GLUCOSE 103*   Recent Labs  Basename 07/23/12 2140 07/23/12 1535 07/23/12 0925   CKTOTAL -- -- --   CKMB -- -- --   CKMBINDEX -- -- --   TROPONINI 0.33* 0.41* 0.62*   Recent Labs  Basename 07/23/12 0926   CHOL 100   HDL 52   LDLCALC 38   TRIG 51   CHOLHDL 1.9   LDLDIRECT --   Disposition:  Discharge Orders    Future Appointments: Provider: Department: Dept Phone: [name]:   07/26/2012 1:45 PM Minda Meo, PA-C [facility] Main [facility] [place]) [phone] [facility]   08/01/2012 2:00 PM Rosalio Macadamia, NP [facility] Main [facility] [place]) [phone] [facility]     Follow-up Information    Follow up with Norma Fredrickson, NP. On 08/01/2012. (At 2:00  PM for follow-up. )    Contact information:   [address] [place] [address] [phone]       Schedule an appointment as soon as possible for a visit with PRYOR,ROBERT E, MD. (In 1-2 weeks. )         Discharge Medications:    Medication List     As of 07/25/2012  5:05 PM    START taking these medications         clopidogrel 75 MG tablet   Commonly known as: PLAVIX   Take 1 tablet (75 mg total) by mouth daily with breakfast.      pantoprazole 40 MG tablet   Commonly known as: PROTONIX   Take 1 tablet (40 mg total) by mouth daily.   Replaces: esomeprazole 40 MG capsule      CONTINUE taking these medications         ALPHAGAN P 0.1 % Soln   Generic drug: brimonidine      aspirin 81 MG tablet      cetirizine 10 MG tablet   Commonly known as: ZYRTEC      donepezil 5 MG tablet   Commonly known as: ARICEPT      furosemide 20 MG tablet   Commonly known as: LASIX  HYDROcodone-acetaminophen 5-500 MG per tablet   Commonly known as: VICODIN      KLOR-CON M20 20 MEQ tablet   Generic drug: potassium chloride SA      latanoprost 0.005 % ophthalmic solution   Commonly known as: XALATAN      levothyroxine 50 MCG tablet   Commonly known as: SYNTHROID, LEVOTHROID      lisinopril 5 MG tablet   Commonly known as: PRINIVIL,ZESTRIL      meclizine 25 MG tablet   Commonly known as: ANTIVERT      metoprolol tartrate 25 MG tablet   Commonly known as: LOPRESSOR      nitroGLYCERIN 0.4 MG SL tablet   Commonly known as: NITROSTAT      promethazine 25 MG tablet   Commonly known as: PHENERGAN      sertraline 50 MG tablet   Commonly known as: ZOLOFT      STOP taking these medications         esomeprazole 40 MG capsule   Commonly known as: NEXIUM   Replaced by: pantoprazole 40 MG tablet          Where to get your medications    These are the prescriptions that you need to pick up. We sent them to a specific pharmacy, so you will need to go  there to get them.   [id number] [place] - [address]    [address] [place] [address]    Phone: [phone]        clopidogrel 75 MG tablet   pantoprazole 40 MG tablet           Outstanding Labs/Studies: None  Duration of Discharge Encounter: Greater than 30 minutes including physician time.  Signed, R. Hurman Horn, PA-C 07/25/2012, 5:05 PM

## 2012-07-25 NOTE — Telephone Encounter (Signed)
**Note De-Identified  Obfuscation** Pt's sister, Lupita Leash, states that pt. has all of her medications and is doing ok. She states that they are aware of appt. tomorrow to have pacer checked at 1:45. Lupita Leash states that they cannot keep appt. scheduled with Norma Fredrickson, NP on 11/11 due to other appt's that day so appt. Re-scheduled for 11/12 at 2:30.

## 2012-07-25 NOTE — Telephone Encounter (Signed)
Transitional care patients

## 2012-07-25 NOTE — Progress Notes (Signed)
Pt discharged to home per MD order. Pt and family received all discharge instructions and medication information including follow-up appointments and prescriptions.  Pt alert and oriented at discharge with no complaints of pain.  Pt escorted to private vehicle via wheelchair by nurse tech. Efraim Kaufmann

## 2012-07-25 NOTE — Progress Notes (Signed)
Patient ID: [name], female   DOB: [birth year], 78 y.o.   MRN: [id number]   Subjective:  Denies CP or dyspnea Does not want cath ""Had a stroke one time and didn't work out the other time   Objective:  Filed Vitals:   07/24/12 0947 07/24/12 1400 07/24/12 2130 07/25/12 0600  BP: 99/64 125/85 101/67 124/81  Pulse: 80 74 75 76  Temp:  98.1 F (36.7 C) 99.2 F (37.3 C) 98 F (36.7 C)  TempSrc:      Resp:  20 17 16   Height:      Weight:      SpO2:  97% 95% 94%    Intake/Output from previous day: No intake or output data in the 24 hours ending 07/25/12 0750  Physical Exam: Physical exam: Well-developed well-nourished in no acute distress.  Skin is warm and dry.  HEENT is normal.  Neck is supple. Chest is clear to auscultation with normal expansion.  Cardiovascular exam is regular rate and rhythm.  Abdominal exam nontender or distended. No masses palpated. Extremities show no edema. neuro grossly intact    Lab Results: Basic Metabolic Panel:  [facility] 07/23/12 0921  NA 141  K 4.0  CL 105  CO2 25  GLUCOSE 103*  BUN 23  CREATININE 0.94  CALCIUM 8.9  MG --  PHOS --   CBC:  Basename 07/24/12 0625 07/23/12 0921  WBC 6.1 9.8  NEUTROABS -- 7.6  HGB 12.4 11.6*  HCT 37.8 35.3*  MCV 90.4 91.2  PLT 144* 171   Cardiac Enzymes:  Basename 07/23/12 2140 07/23/12 1535 07/23/12 0925  CKTOTAL -- -- --  CKMB -- -- --  CKMBINDEX -- -- --  TROPONINI 0.33* 0.41* 0.62*     Assessment/Plan:  1 NSTEMI - continue ASA, plavix, BB and statin; no heparin given h/o aortic dissection" Lexiscan myovue today and cath only if high risk per GT/BC 2 Chronic aortic dissection ( beginning at diaphragmatic hiatus). 3 Atrial fibrillation - continue beta blocker and ASA; not on coumadin given fall risk and aortic dissection.  Charlton Haws 07/25/2012, 7:50 AM

## 2012-07-26 ENCOUNTER — Encounter: Payer: Medicare FFS | Admitting: Cardiology

## 2012-08-01 ENCOUNTER — Encounter: Payer: Medicare FFS | Admitting: Nurse Practitioner

## 2012-08-02 ENCOUNTER — Encounter: Payer: Self-pay | Admitting: Internal Medicine

## 2012-08-02 ENCOUNTER — Ambulatory Visit: Payer: Medicare FFS | Admitting: Nurse Practitioner

## 2012-08-02 ENCOUNTER — Ambulatory Visit (INDEPENDENT_AMBULATORY_CARE_PROVIDER_SITE_OTHER): Payer: Medicare FFS | Admitting: Cardiology

## 2012-08-02 ENCOUNTER — Encounter: Payer: Self-pay | Admitting: Cardiology

## 2012-08-02 VITALS — BP 156/102 | HR 79 | Ht 65.5 in

## 2012-08-02 DIAGNOSIS — I5032 Chronic diastolic (congestive) heart failure: Secondary | ICD-10-CM

## 2012-08-02 DIAGNOSIS — I4891 Unspecified atrial fibrillation: Secondary | ICD-10-CM

## 2012-08-02 DIAGNOSIS — Z950 Presence of cardiac pacemaker: Secondary | ICD-10-CM

## 2012-08-02 LAB — PACEMAKER DEVICE OBSERVATION
BATTERY VOLTAGE: 2.98 V
BRDY-0004RV: 110 {beats}/min
BRDY-0005RV: 70 {beats}/min
DEVICE MODEL PM: 7227567
VENTRICULAR PACING PM: 99

## 2012-08-02 NOTE — Patient Instructions (Signed)
Your physician wants you to follow-up in: 6 months with Dr Taylor You will receive a reminder letter in the mail two months in advance. If you don't receive a letter, please call our office to schedule the follow-up appointment.  

## 2012-08-15 NOTE — Progress Notes (Signed)
ELECTROPHYSIOLOGY OFFICE NOTE  Patient ID: [name] MRN: [id number], DOB/AGE: 24-Dec-1932   Date of Visit: 08/15/2012  Primary Physician: Jerene Pitch, MD Primary Cardiologist: Ladona Ridgel, MD Reason for Visit: Device follow-up  History of Present Illness  [name] is a pleasant 78 year old woman with CAD, aortic dissection, type B, permanent atrial fibrillation/flutter (not on anticoagulation due to falls), tachy-brady syndrome s/p PPM and chronic diastolic HF who presents today for routine electrophysiology followup.  Since last being seen in our clinic, she was hospitalized 07/23/12-07/25/12 with NSTEMI. Her troponin was mildly elevated and she underwent a Lexiscan Myoview which was felt to be a low risk study. Dr. Jens Som recommended medical management. She was loaded on Plavix and started on statin therapy. Aspirin and beta blocker were continued.   Today, she denies symptoms of palpitations, chest pain, shortness of breath, orthopnea, PND, lower extremity edema, dizziness, near syncope or syncope.  Ms. Donavan [name] reports she feels that she is tolerating medications without difficulties and is without complaints today.   Past Medical History  Diagnosis Date  . Atrial flutter     recurrent asymptomatic  . CAD (coronary artery disease)     Lexiscan Myoview 11/13: no ischemia, septal and lateral defect (V-pacing vs tissue atten vs infarct), LVEF 58%  . History of stroke   . Aortic dissection     type B, contained  . HTN (hypertension)   . Ischemic heart disease   . MI (myocardial infarction)   . GERD (gastroesophageal reflux disease)   . Depression   . Stroke   . Hyperlipidemia   . Pacemaker   . Chronic diastolic CHF (congestive heart failure) 07/2012    Echo: LVEF 50-55%, grade 2 dd, mild MR/TR, mild LA dilatation, PASP 37 mmHg    Past Surgical History  Procedure Date  . Echcardiogram 2007, 2008  . Appendectomy   . Abdominal hysterectomy   . Joint replacement    complete RT knee     Allergies/Intolerances Allergies  Allergen Reactions  . Amoxicillin     Unknown reaction  . Chocolate Nausea And Vomiting and Other (See Comments)    headache  . Latex Other (See Comments)    Blistering and skin loss    Current Home Medications Current Outpatient Prescriptions  Medication Sig Dispense Refill  . ALPHAGAN P 0.1 % SOLN Place 1 drop into both eyes every 8 (eight) hours.       Marland Kitchen aspirin 81 MG tablet Take 81 mg by mouth daily.        . cetirizine (ZYRTEC) 10 MG tablet Take 10 mg by mouth daily.        . clopidogrel (PLAVIX) 75 MG tablet Take 1 tablet (75 mg total) by mouth daily with breakfast.  30 tablet  3  . donepezil (ARICEPT) 5 MG tablet Take 10 mg by mouth daily.       . furosemide (LASIX) 20 MG tablet Take 20 mg by mouth daily as needed.       Marland Kitchen HYDROcodone-acetaminophen (VICODIN) 5-500 MG per tablet Take 1 tablet by mouth every 6 (six) hours as needed. pain      . KLOR-CON M20 20 MEQ tablet Take 1 tablet by mouth daily as needed. Only takes when taking fluid pill      . latanoprost (XALATAN) 0.005 % ophthalmic solution Place 1 drop into both eyes at bedtime.       Marland Kitchen levothyroxine (SYNTHROID, LEVOTHROID) 50 MCG tablet Take 1 tablet by mouth Daily.      Marland Kitchen  lisinopril (PRINIVIL,ZESTRIL) 5 MG tablet Take 5 mg by mouth daily.       . meclizine (ANTIVERT) 25 MG tablet Take 25 mg by mouth 2 (two) times daily as needed. For vertigo      . metoprolol tartrate (LOPRESSOR) 25 MG tablet Take 12.5 mg by mouth 2 (two) times daily.       . nitroGLYCERIN (NITROSTAT) 0.4 MG SL tablet Place 0.4 mg under the tongue every 5 (five) minutes as needed. For chest pain      . pantoprazole (PROTONIX) 40 MG tablet Take 1 tablet (40 mg total) by mouth daily.  30 tablet  3  . promethazine (PHENERGAN) 25 MG tablet Take 25 mg by mouth every 6 (six) hours as needed. nausea      . sertraline (ZOLOFT) 50 MG tablet Take 50 mg by mouth daily.          Social History Social  History  . Marital Status: Widowed   Occupational History  . retired    Social History Main Topics  . Smoking status: Never Smoker   . Smokeless tobacco: Never Used  . Alcohol Use: No  . Drug Use: No   Review of Systems General: No chills, fever, night sweats or weight changes Cardiovascular: No chest pain, dyspnea on exertion, edema, orthopnea, palpitations, paroxysmal nocturnal dyspnea Dermatological: No rash, lesions or masses Respiratory: No cough, dyspnea Urologic: No hematuria, dysuria Abdominal: No nausea, vomiting, diarrhea, bright red blood per rectum, melena, or hematemesis Neurologic: No visual changes, weakness, changes in mental status All other systems reviewed and are otherwise negative except as noted above.  Physical Exam Blood pressure 156/102, pulse 79, height 5' 5.5" (1.664 m), SpO2 96.00%.  General: Well developed, well appearing 78 year old female in no acute distress. HEENT: Normocephalic, atraumatic. EOMs intact. Sclera nonicteric. Oropharynx clear.  Neck: Supple without bruits. No JVD. Lungs: Respirations regular and unlabored, CTA bilaterally. No wheezes, rales or rhonchi. Heart: Regular S1, S2. No murmurs, rub, S3 or S4. Abdomen: Soft, non-distended.  Extremities: No clubbing, cyanosis or edema. DP/PT/Radials 2+ and equal bilaterally. Psych: Normal affect. Neuro: Alert and oriented X 3. Moves all extremities spontaneously.   Diagnostics Device interrogation shows normal PPM function with good battery status and stable lead parameters/measurements; no episodes; V paced 99% of the time; no programming changes made; see PaceArt report  Assessment and Plan 1. Tachy-brady syndrome, PPM in place 2. Atrial fibrillation/flutter 3. CAD/recent NSTEMI 4. Chronic diastolic HF [name] is doing well after her recent hospitalization. She has no complaints. Her PPM function is normal and there were no programming changes made today. She will continue medical  therapy for CAD. She has no angina. She appears euvolemic by exam today. Per Dr. Lubertha Basque office note from May 2013, she was taken off warfarin due to falls. She will follow-up as scheduled with Dr. Ladona Ridgel in 3 months.   Signed, Rick Duff, PA-C 08/15/2012, 1:00 PM

## 2012-08-17 ENCOUNTER — Ambulatory Visit: Payer: Medicare FFS | Admitting: Nurse Practitioner

## 2012-08-25 ENCOUNTER — Encounter: Payer: Self-pay | Admitting: Physician Assistant

## 2012-08-25 ENCOUNTER — Ambulatory Visit (INDEPENDENT_AMBULATORY_CARE_PROVIDER_SITE_OTHER): Payer: Medicare FFS | Admitting: Physician Assistant

## 2012-08-25 ENCOUNTER — Ambulatory Visit: Payer: Medicare FFS | Admitting: Nurse Practitioner

## 2012-08-25 VITALS — BP 126/86 | HR 75 | Ht 66.0 in | Wt 143.0 lb

## 2012-08-25 DIAGNOSIS — I5032 Chronic diastolic (congestive) heart failure: Secondary | ICD-10-CM

## 2012-08-25 DIAGNOSIS — I7100 Dissection of unspecified site of aorta: Secondary | ICD-10-CM

## 2012-08-25 DIAGNOSIS — I4891 Unspecified atrial fibrillation: Secondary | ICD-10-CM

## 2012-08-25 DIAGNOSIS — I509 Heart failure, unspecified: Secondary | ICD-10-CM

## 2012-08-25 DIAGNOSIS — I2510 Atherosclerotic heart disease of native coronary artery without angina pectoris: Secondary | ICD-10-CM

## 2012-08-25 DIAGNOSIS — I10 Essential (primary) hypertension: Secondary | ICD-10-CM

## 2012-08-25 NOTE — Patient Instructions (Addendum)
MAKE SURE TO FOLLOW UP WITH DR. Ladona Ridgel IN MAY 2014. WE WILL SEND OUT A REMINDER CLOSER TO THE APPT TIME TO MAKE THE APPT.  NO CHANGES WERE MADE TODAY

## 2012-08-25 NOTE — Progress Notes (Signed)
[address] [place], [place]  [address] Phone: [phone], Fax:  [phone]  Date:  08/25/2012   Name:  [name]   DOB:  [birth year]   MRN:  [id number]  PCP:  Jerene Pitch, MD  Primary Cardiologist:  Dr. Lewayne Bunting  Primary Electrophysiologist:  Dr. Lewayne Bunting    History of Present Illness: [name] is a 78 y.o. female who returns for follow up after recent admission to the hospital for NSTEMI.  She has a history of CAD, status post prior PCI in 2008 at [facility], type B aortic dissection followed by Dr. Darrick Penna, atrial flutter (not on anticoagulation due to fall risk), prior stroke, HTN, HL, diastolic CHF , and tachybradycardia syndrome, status post pacemaker implantation. Patient was admitted 11/2-11/4. She was transferred from an outlying hospital after presenting with sudden onset of chest pain associated with nausea and vomiting. Initial cardiac markers were abnormal. Echocardiogram 07/23/12: EF 50-55%, cannot exclude distal inferoseptal HK, grade 2 diastolic dysfunction, trivial AI, mild MR, mild LAE, mild TR, PASP 37. Stress test was arranged for risk stratification. Lexiscan Myoview 07/25/12: Low risk, fixed defect in the septum and lateral wall-question related to ventricular pacing versus soft tissue attenuation (prior scar cannot be excluded), EF 50%, no ischemia. Medical therapy was recommended. She was placed on aspirin and Plavix.  She is doing well. She denies any chest pain, significant dyspnea, orthopnea, PND or syncope. She has occasional pedal edema. She takes when necessary Lasix.  Labs (11/13):    K 4, creatinine 0.94, ALT 13, peak troponin 0.62, LDL 38, Hgb 11.6  Wt Readings from Last 3 Encounters:  08/25/12 143 lb (64.864 kg)  07/23/12 141 lb 9.6 oz (64.229 kg)  07/14/12 145 lb (65.772 kg)     Past Medical History  Diagnosis Date  . Atrial flutter     recurrent asymptomatic  . CAD (coronary artery disease)       Lexiscan Myoview 11/13: no ischemia, septal and lateral defect (V-pacing vs tissue atten vs infarct), LVEF 58%  . History of stroke   . Aortic dissection     type B, contained  . HTN (hypertension)   . Ischemic heart disease   . MI (myocardial infarction)   . GERD (gastroesophageal reflux disease)   . Depression   . Stroke   . Hyperlipidemia   . Pacemaker   . Chronic diastolic CHF (congestive heart failure) 07/2012    Echo: LVEF 50-55%, grade 2 dd, mild MR/TR, mild LA dilatation, PASP 37 mmHg    Current Outpatient Prescriptions  Medication Sig Dispense Refill  . ALPHAGAN P 0.1 % SOLN Place 1 drop into both eyes every 8 (eight) hours.       Marland Kitchen aspirin 81 MG tablet Take 81 mg by mouth daily.        . cetirizine (ZYRTEC) 10 MG tablet Take 10 mg by mouth daily.        . clopidogrel (PLAVIX) 75 MG tablet Take 1 tablet (75 mg total) by mouth daily with breakfast.  30 tablet  3  . donepezil (ARICEPT) 5 MG tablet Take 10 mg by mouth daily.       . ferrous gluconate (FERGON) 325 MG tablet Take 325 mg by mouth daily with breakfast.      . furosemide (LASIX) 20 MG tablet Take 20 mg by mouth daily as needed.       Marland Kitchen HYDROcodone-acetaminophen (VICODIN) 5-500 MG per tablet Take 1 tablet  by mouth every 6 (six) hours as needed. pain      . KLOR-CON M20 20 MEQ tablet Take 1 tablet by mouth daily as needed. Only takes when taking fluid pill      . latanoprost (XALATAN) 0.005 % ophthalmic solution Place 1 drop into both eyes at bedtime.       Marland Kitchen levothyroxine (SYNTHROID, LEVOTHROID) 75 MCG tablet Take 75 mcg by mouth daily.      Marland Kitchen lisinopril (PRINIVIL,ZESTRIL) 5 MG tablet Take 5 mg by mouth daily.       . meclizine (ANTIVERT) 25 MG tablet Take 25 mg by mouth 2 (two) times daily as needed. For vertigo      . metoprolol tartrate (LOPRESSOR) 25 MG tablet Take 25 mg by mouth 2 (two) times daily.       . nitroGLYCERIN (NITROSTAT) 0.4 MG SL tablet Place 0.4 mg under the tongue every 5 (five) minutes as  needed. For chest pain      . pantoprazole (PROTONIX) 40 MG tablet Take 1 tablet (40 mg total) by mouth daily.  30 tablet  3  . promethazine (PHENERGAN) 25 MG tablet Take 25 mg by mouth every 6 (six) hours as needed. nausea      . sertraline (ZOLOFT) 50 MG tablet Take 50 mg by mouth daily.          Allergies: Allergies  Allergen Reactions  . Amoxicillin     Unknown reaction  . Chocolate Nausea And Vomiting and Other (See Comments)    headache  . Latex Other (See Comments)    Blistering and skin loss    Social History:  The patient  reports that she has never smoked. She has never used smokeless tobacco. She reports that she does not drink alcohol or use illicit drugs.   ROS:  Please see the history of present illness.      All other systems reviewed and negative.   PHYSICAL EXAM: VS:  BP 126/86  Pulse 75  Ht 5\' 6"  (1.676 m)  Wt 143 lb (64.864 kg)  BMI 23.08 kg/m2 Well nourished, well developed, in no acute distress HEENT: normal Neck: no JVD Cardiac:  normal S1, S2; RRR; no murmur Lungs:  clear to auscultation bilaterally, no wheezing, rhonchi or rales Abd: soft, nontender, no hepatomegaly Ext: Trace-1+ bilateral LE edema Skin: warm and dry Neuro:  CNs 2-12 intact, no focal abnormalities noted  EKG:  V. paced, HR 75      ASSESSMENT AND PLAN:  1. Coronary Artery Disease:   She is doing well s/p recent NSTEMI that has been treated medically.  Continue ASA and Plavix.  She did not require statin Rx due to very low LDL in the setting of no therapy.  Follow up as planned with Dr. Lewayne Bunting.  2. Hypertension:  Controlled.  Continue current therapy.  3. Atrial Flutter:  She is not a coumadin candidate.  Rate is controlled.  4. Type B Abdominal Aortic Dissection:  Followed by Dr. Darrick Penna.  5. Tachy-Brady Syndrome, s/p Pacer:  Follow up as planned with Dr. Lewayne Bunting.  She saw B. Edmisten, PA-C earlier this month as well.    6. Diastolic CHF:  Volume stable.  Continue  current Rx.  7. Disposition:  Follow up with Dr. Lewayne Bunting as planned.   Signed, Tereso Newcomer, PA-C  10:39 AM 08/25/2012

## 2012-12-06 ENCOUNTER — Other Ambulatory Visit: Payer: Self-pay | Admitting: Physician Assistant

## 2012-12-21 ENCOUNTER — Other Ambulatory Visit: Payer: Self-pay | Admitting: Physician Assistant

## 2013-02-10 ENCOUNTER — Encounter: Payer: Medicare FFS | Admitting: Internal Medicine

## 2013-02-21 ENCOUNTER — Encounter: Payer: Self-pay | Admitting: Internal Medicine

## 2013-02-21 ENCOUNTER — Ambulatory Visit (INDEPENDENT_AMBULATORY_CARE_PROVIDER_SITE_OTHER): Payer: Medicare FFS | Admitting: Internal Medicine

## 2013-02-21 VITALS — BP 110/78 | Ht 65.5 in | Wt 132.2 lb

## 2013-02-21 DIAGNOSIS — I5032 Chronic diastolic (congestive) heart failure: Secondary | ICD-10-CM

## 2013-02-21 DIAGNOSIS — I509 Heart failure, unspecified: Secondary | ICD-10-CM

## 2013-02-21 DIAGNOSIS — I4891 Unspecified atrial fibrillation: Secondary | ICD-10-CM

## 2013-02-21 DIAGNOSIS — Z950 Presence of cardiac pacemaker: Secondary | ICD-10-CM

## 2013-02-21 LAB — PACEMAKER DEVICE OBSERVATION
BRDY-0002RV: 75 {beats}/min
BRDY-0004RV: 110 {beats}/min
BRDY-0005RV: 70 {beats}/min
DEVICE MODEL PM: 7227567
RV LEAD IMPEDENCE PM: 575 Ohm
RV LEAD THRESHOLD: 0.75 V

## 2013-02-21 NOTE — Assessment & Plan Note (Signed)
She maintains atrial fibrillation but her ventricular rate appears to be well-controlled. No change in medical therapy.

## 2013-02-21 NOTE — Assessment & Plan Note (Signed)
Her [employer]. Jude dual-chamber pacemaker is working normally. Underlying rhythm today is complete heart block. We'll plan to recheck in several months.

## 2013-02-21 NOTE — Progress Notes (Signed)
HPI [name] returns today for followup.she is a very pleasant 79 year old woman with recurrent stroke, vascular heart disease, and hypertension. She has persistent atrial fibrillation.  In the interim, she has been to the emergency room with an episode of altered consciousness. A CT scan was obtained which demonstrated no new neurologic problems. The patient is not sure what happened to her. Her family members who are with her noted that she has had reduced oral intake, and has become progressively more sedentary and unstable on her feet. She is lost weight. Allergies  Allergen Reactions  . Amoxicillin     Unknown reaction  . Chocolate Nausea And Vomiting and Other (See Comments)    headache  . Latex Other (See Comments)    Blistering and skin loss     Current Outpatient Prescriptions  Medication Sig Dispense Refill  . aspirin 81 MG tablet Take 81 mg by mouth daily.        . cetirizine (ZYRTEC) 10 MG tablet Take 10 mg by mouth daily.        . clopidogrel (PLAVIX) 75 MG tablet TAKE 1 TABLET (75 MG TOTAL) BY MOUTH DAILY WITH BREAKFAST.  30 tablet  3  . donepezil (ARICEPT) 5 MG tablet Take 10 mg by mouth daily.       . ferrous gluconate (FERGON) 325 MG tablet Take 325 mg by mouth daily with breakfast.      . furosemide (LASIX) 20 MG tablet Take 20 mg by mouth daily as needed.       Marland Kitchen HYDROcodone-acetaminophen (VICODIN) 5-500 MG per tablet Take 1 tablet by mouth every 6 (six) hours as needed. pain      . KLOR-CON M20 20 MEQ tablet Take 1 tablet by mouth daily as needed. Only takes when taking fluid pill      . levothyroxine (SYNTHROID, LEVOTHROID) 75 MCG tablet Take 75 mcg by mouth daily.      Marland Kitchen lisinopril (PRINIVIL,ZESTRIL) 5 MG tablet Take 5 mg by mouth daily.       . meclizine (ANTIVERT) 25 MG tablet Take 25 mg by mouth 2 (two) times daily as needed. For vertigo      . metoprolol tartrate (LOPRESSOR) 25 MG tablet Take 25 mg by mouth 2 (two) times daily.       . nitroGLYCERIN  (NITROSTAT) 0.4 MG SL tablet Place 0.4 mg under the tongue every 5 (five) minutes as needed. For chest pain      . pantoprazole (PROTONIX) 40 MG tablet TAKE 1 TABLET (40 MG TOTAL) BY MOUTH DAILY.  30 tablet  5  . sertraline (ZOLOFT) 50 MG tablet Take 50 mg by mouth daily.         No current facility-administered medications for this visit.     Past Medical History  Diagnosis Date  . Atrial flutter     recurrent asymptomatic  . CAD (coronary artery disease)     Lexiscan Myoview 11/13: no ischemia, septal and lateral defect (V-pacing vs tissue atten vs infarct), LVEF 58%  . History of stroke   . Aortic dissection     type B, contained  . HTN (hypertension)   . Ischemic heart disease   . MI (myocardial infarction)   . GERD (gastroesophageal reflux disease)   . Depression   . Stroke   . Hyperlipidemia   . Pacemaker   . Chronic diastolic CHF (congestive heart failure) 07/2012    Echo: LVEF 50-55%, grade 2 dd, mild MR/TR, mild LA dilatation, PASP 37  mmHg    ROS:   All systems reviewed and negative except as noted in the HPI.   Past Surgical History  Procedure Laterality Date  . Echcardiogram  2007, 2008  . Appendectomy    . Abdominal hysterectomy    . Joint replacement      complete RT knee     Family History  Problem Relation Age of Onset  . Colon cancer Father   . Coronary artery disease Sister   . Stroke Father      History   Social History  . Marital Status: Widowed    Spouse Name: N/A    Number of Children: N/A  . Years of Education: N/A   Occupational History  . retired    Social History Main Topics  . Smoking status: Never Smoker   . Smokeless tobacco: Never Used  . Alcohol Use: No  . Drug Use: No  . Sexually Active: Not Currently    Birth Control/ Protection: Post-menopausal   Other Topics Concern  . Not on file   Social History Narrative  . No narrative on file     BP 110/78  Ht 5' 5.5" (1.664 m)  Wt 132 lb 3.2 oz (59.966 kg)  BMI  21.66 kg/m2  Physical Exam:  Diskemp tappearing 79 year old woman,NAD HEENT: Unremarkable Neck:  No JVD, no thyromegally Lymphatics:  No adenopathy Back:  No CVA tenderness Lungs:  Clear with no wheezes or rhonchi. HEART:  Iregular rate rhythm, no murmurs, no rubs, no clicks Abd:  soft, positive bowel sounds, no organomegally, no rebound, no guarding Ext:  2 plus pulses, no edema, no cyanosis, no clubbing Skin:  No rashes no nodules Neuro:  CN II through XII intact, motor grossly intact  DEVICE  Normal device function.  See PaceArt for details.   Assess/Plan:

## 2013-02-21 NOTE — Patient Instructions (Addendum)
Your physician wants you to follow-up in: 6 months with device clinic and 12 month with Dr Court Joy will receive a reminder letter in the mail two months in advance. If you don't receive a letter, please call our office to schedule the follow-up appointment.

## 2013-02-21 NOTE — Assessment & Plan Note (Signed)
Her heart failure symptoms are class 2-3. She has become progressively more sedentary. She will maintain a low-sodium diet and continue her current medical therapy.

## 2013-07-12 ENCOUNTER — Encounter: Payer: Self-pay | Admitting: Vascular Surgery

## 2013-07-13 ENCOUNTER — Inpatient Hospital Stay
Admission: RE | Admit: 2013-07-13 | Discharge: 2013-07-13 | Disposition: A | Discharge status: Home / Self Care | Payer: Medicaid - Out of State | Source: Ambulatory Visit | Attending: Vascular Surgery | Admitting: Vascular Surgery

## 2013-07-13 ENCOUNTER — Encounter: Payer: Self-pay | Admitting: Vascular Surgery

## 2013-07-13 ENCOUNTER — Ambulatory Visit (INDEPENDENT_AMBULATORY_CARE_PROVIDER_SITE_OTHER): Payer: Medicare FFS | Admitting: Vascular Surgery

## 2013-07-13 ENCOUNTER — Ambulatory Visit
Admission: RE | Admit: 2013-07-13 | Discharge: 2013-07-13 | Disposition: A | Discharge status: Home / Self Care | Payer: Medicare FFS | Source: Ambulatory Visit | Attending: Vascular Surgery | Admitting: Vascular Surgery

## 2013-07-13 VITALS — BP 117/92 | HR 77 | Ht 65.5 in | Wt 134.0 lb

## 2013-07-13 DIAGNOSIS — I714 Abdominal aortic aneurysm, without rupture, unspecified: Secondary | ICD-10-CM

## 2013-07-13 DIAGNOSIS — I7103 Dissection of thoracoabdominal aorta: Secondary | ICD-10-CM

## 2013-07-13 DIAGNOSIS — Z48812 Encounter for surgical aftercare following surgery on the circulatory system: Secondary | ICD-10-CM

## 2013-07-13 DIAGNOSIS — I712 Thoracic aortic aneurysm, without rupture, unspecified: Secondary | ICD-10-CM

## 2013-07-13 DIAGNOSIS — I7100 Dissection of unspecified site of aorta: Secondary | ICD-10-CM

## 2013-07-13 MED ORDER — IOHEXOL 350 MG/ML SOLN
75.0000 mL | Freq: Once | INTRAVENOUS | Status: AC | PRN
  Administered 2013-07-13: 75 mL via INTRAVENOUS

## 2013-07-13 NOTE — Progress Notes (Signed)
Patient is a 79 year old female returns today for followup for chronic aortic dissection. We have been following this dissection  2008. The aortic diameter was 3.1 cm at the time of original dissection. She was last seen in October of 2013. She has some mild dementia which has become progressively worse over the last year and she is now living in a skilled nursing facility due to safety concerns living alone. She denies abdominal or back pain. She has chronic nausea and some mild anorexia. Other chronic medical problems include coronary artery disease, remote history of stroke, hypertension, reflux, hyperlipidemia all of which are currently stable.  Review of systems: She denies chest pain. She denies shortness of breath  Past Medical History  Diagnosis Date  . Atrial flutter     recurrent asymptomatic  . CAD (coronary artery disease)     Lexiscan Myoview 11/13: no ischemia, septal and lateral defect (V-pacing vs tissue atten vs infarct), LVEF 58%  . History of stroke   . Aortic dissection     type B, contained  . HTN (hypertension)   . Ischemic heart disease   . MI (myocardial infarction)   . GERD (gastroesophageal reflux disease)   . Depression   . Stroke   . Hyperlipidemia   . Pacemaker   . Chronic diastolic CHF (congestive heart failure) 07/2012    Echo: LVEF 50-55%, grade 2 dd, mild MR/TR, mild LA dilatation, PASP 37 mmHg   Dementia     Past Surgical History   Procedure  Date   .  Echcardiogram  2007, 2008      History      Social History   .  Marital Status:  Widowed       Spouse Name:  N/A       Number of Children:  N/A   .  Years of Education:  N/A      Occupational History   .  retired        Social History Main Topics   .  Smoking status:  Never Smoker    .  Smokeless tobacco:  Never Used   .  Alcohol Use:  No   .  Drug Use:  No   .  Sexually Active:  None      Other Topics  Concern   .  None      Social History Narrative   .  None      Family History    Problem  Relation  Age of Onset   .  Colon cancer  Father     .  Coronary artery disease  Sister     .  Stroke  Father       Physical exam:  Filed Vitals:   07/13/13 1223  BP: 117/92  Pulse: 77  Height: 5' 5.5" (1.664 m)  Weight: 134 lb (60.782 kg)  SpO2: 100%   Chest: Clear to auscultation bilaterally  Cardiac: Regular rate and rhythm without murmur  Neck: No carotid bruits  Abdomen: Pulsatile mass in epigastrium nontender  Extremities: No significant edema, 2+ femoral pulses  Neuro: Symmetric upper extremity and lower extremity motor strength which is 5 over 5  Data: CT angiogram of the chest abdomen and pelvis was reviewed today. The aneurysm currently is in the descending thoracic upper abdomen and is 4.1 cm in size. This is increased from 3.6 cm one year ago.  This is a type II aortic dissection. This represents 7 mm growth over 2 years.  The  dissection extends from the descending thoracic aorta into the right common iliac visceral vessels and renals are perfused off the true lumen and are all widely patent  Assessment: Stable type II aortic dissection 4.1 cm in diameter asymptomatic  Plan: Repeat CT Angio chest abdomen pelvis 2 years.  Consideration would be given for repair if the diameter reaches 6 cm. However if she continues to have progressive decline and worsening of her dementia she may not be a candidate for operation  Fabienne Bruns, MD [facility] Office: [phone] Pager: [phone]

## 2013-07-18 ENCOUNTER — Encounter: Payer: Self-pay | Admitting: Internal Medicine

## 2013-08-23 ENCOUNTER — Ambulatory Visit (INDEPENDENT_AMBULATORY_CARE_PROVIDER_SITE_OTHER): Payer: Medicare FFS | Admitting: *Deleted

## 2013-08-23 DIAGNOSIS — Z950 Presence of cardiac pacemaker: Secondary | ICD-10-CM

## 2013-08-23 DIAGNOSIS — I4891 Unspecified atrial fibrillation: Secondary | ICD-10-CM

## 2013-08-23 LAB — MDC_IDC_ENUM_SESS_TYPE_INCLINIC
Brady Statistic RA Percent Paced: 0 %
Brady Statistic RV Percent Paced: 99.09 %
Date Time Interrogation Session: 20141203144111
Implantable Pulse Generator Serial Number: 7227567
Lead Channel Impedance Value: 562.5 Ohm
Lead Channel Pacing Threshold Amplitude: 0.75 V
Lead Channel Pacing Threshold Pulse Width: 0.5 ms
Lead Channel Sensing Intrinsic Amplitude: 12 mV
Lead Channel Sensing Intrinsic Amplitude: 4.1 mV
Lead Channel Setting Pacing Amplitude: 2.5 V

## 2013-08-23 NOTE — Progress Notes (Signed)
Device check in clinic, all functions normal, no high ventricular rates, full details in PaceArt.  ROV w/ Dr. Ladona Ridgel in 63mo.

## 2013-09-12 ENCOUNTER — Encounter: Payer: Self-pay | Admitting: Internal Medicine

## 2014-02-22 ENCOUNTER — Ambulatory Visit (INDEPENDENT_AMBULATORY_CARE_PROVIDER_SITE_OTHER): Payer: Medicare FFS | Admitting: Internal Medicine

## 2014-02-22 ENCOUNTER — Encounter (INDEPENDENT_AMBULATORY_CARE_PROVIDER_SITE_OTHER): Payer: Self-pay

## 2014-02-22 ENCOUNTER — Encounter: Payer: Self-pay | Admitting: Internal Medicine

## 2014-02-22 VITALS — BP 135/80 | HR 79 | Ht 65.5 in | Wt 152.0 lb

## 2014-02-22 DIAGNOSIS — I5032 Chronic diastolic (congestive) heart failure: Secondary | ICD-10-CM

## 2014-02-22 DIAGNOSIS — I509 Heart failure, unspecified: Secondary | ICD-10-CM

## 2014-02-22 DIAGNOSIS — I4891 Unspecified atrial fibrillation: Secondary | ICD-10-CM | POA: Diagnosis not present

## 2014-02-22 DIAGNOSIS — Z950 Presence of cardiac pacemaker: Secondary | ICD-10-CM | POA: Diagnosis not present

## 2014-02-22 DIAGNOSIS — I10 Essential (primary) hypertension: Secondary | ICD-10-CM

## 2014-02-22 LAB — BASIC METABOLIC PANEL
BUN: 24 mg/dL — AB (ref 6–23)
CHLORIDE: 106 meq/L (ref 96–112)
CO2: 29 mEq/L (ref 19–32)
Calcium: 9.8 mg/dL (ref 8.4–10.5)
Creatinine, Ser: 1.2 mg/dL (ref 0.4–1.2)
GFR: 47.91 mL/min — AB (ref >60.00)
Glucose, Bld: 96 mg/dL (ref 70–99)
Potassium: 4.7 mEq/L (ref 3.5–5.1)
Sodium: 141 mEq/L (ref 135–145)

## 2014-02-22 LAB — MDC_IDC_ENUM_SESS_TYPE_INCLINIC
Battery Remaining Longevity: 110.4 mo
Battery Voltage: 2.96 V
Brady Statistic RV Percent Paced: 99.86 %
Date Time Interrogation Session: 20150604141709
Implantable Pulse Generator Model: 2210
Lead Channel Impedance Value: 562.5 Ohm
Lead Channel Pacing Threshold Amplitude: 0.75 V
Lead Channel Pacing Threshold Amplitude: 0.75 V
Lead Channel Pacing Threshold Pulse Width: 0.5 ms
Lead Channel Pacing Threshold Pulse Width: 0.5 ms
Lead Channel Sensing Intrinsic Amplitude: 12 mV
Lead Channel Sensing Intrinsic Amplitude: 4.1 mV
Lead Channel Setting Pacing Pulse Width: 0.5 ms
Lead Channel Setting Sensing Sensitivity: 2.5 mV
MDC IDC PG SERIAL: 7227567
MDC IDC SET LEADCHNL RV PACING AMPLITUDE: 2.5 V
MDC IDC STAT BRADY RA PERCENT PACED: 0 %

## 2014-02-22 NOTE — Assessment & Plan Note (Signed)
Her symptoms appear to be well controlled. I have encouraged her to maintain a low sodium diet.

## 2014-02-22 NOTE — Assessment & Plan Note (Signed)
Her St. Jude PM is working normally. Will recheck in several monthsl

## 2014-02-22 NOTE — Patient Instructions (Addendum)
Your physician wants you to follow-up in: 12 months with Dr Court Joy will receive a reminder letter in the mail two months in advance. If you don't receive a letter, please call our office to schedule the follow-up appointment.    Remote monitoring is used to monitor your Pacemaker of ICD from home. This monitoring reduces the number of office visits required to check your device to one time per year. It allows [place] to keep an eye on the functioning of your device to ensure it is working properly. You are scheduled for a device check from home on 05/29/14. You may send your transmission at any time that day. If you have a wireless device, the transmission will be sent automatically. After your physician reviews your transmission, you will receive a postcard with your next transmission date.   Your physician recommends that you return for lab work today: BMP

## 2014-02-22 NOTE — Assessment & Plan Note (Signed)
Her ventricular rate appears to be well controlled. She will continue her current meds. 

## 2014-02-22 NOTE — Progress Notes (Signed)
HPI [name] returns today for followup.she is a very pleasant 80 year old woman with recurrent stroke, vascular heart disease, and hypertension. She has persistent atrial fibrillation.  In the interim, she has moved to an assisted living facility and is visibly improved. The patient no longer is bothered by headaches and has gained weight. She is more stable on her feet. Allergies  Allergen Reactions  . Amoxicillin     Unknown reaction  . Chocolate Nausea And Vomiting and Other (See Comments)    headache  . Latex Other (See Comments)    Blistering and skin loss     Current Outpatient Prescriptions  Medication Sig Dispense Refill  . acetaminophen (TYLENOL) 325 MG tablet Take 650 mg by mouth every 4 (four) hours as needed. Take 2 tablets every 4 hours as needed for pain      . aspirin 81 MG tablet Take 81 mg by mouth daily.        Marland Kitchen. azelastine (OPTIVAR) 0.05 % ophthalmic solution Place 1 drop into both eyes 2 (two) times daily. Instill 1 drop into both eyes twice daily as needed for eye irrigation      . brimonidine (ALPHAGAN) 0.2 % ophthalmic solution 1 drop 2 (two) times daily.      . cetirizine (ZYRTEC) 10 MG tablet Take 10 mg by mouth daily.        . clopidogrel (PLAVIX) 75 MG tablet TAKE 1 TABLET (75 MG TOTAL) BY MOUTH DAILY WITH BREAKFAST.  30 tablet  3  . donepezil (ARICEPT) 5 MG tablet Take 10 mg by mouth daily.       . ferrous gluconate (FERGON) 325 MG tablet Take 325 mg by mouth daily with breakfast.      . furosemide (LASIX) 20 MG tablet Take 20 mg by mouth daily as needed.       Marland Kitchen. HYDROcodone-acetaminophen (VICODIN) 5-500 MG per tablet Take 1 tablet by mouth every 6 (six) hours as needed. pain      . KLOR-CON M20 20 MEQ tablet Take 1 tablet by mouth daily as needed. Only takes when taking fluid pill      . LATANOPROST OP Apply 1 drop to eye daily.      Marland Kitchen. levothyroxine (SYNTHROID, LEVOTHROID) 75 MCG tablet Take 75 mcg by mouth daily.      Marland Kitchen. lisinopril (PRINIVIL,ZESTRIL) 5 MG  tablet Take 5 mg by mouth daily.       . meclizine (ANTIVERT) 25 MG tablet Take 25 mg by mouth 2 (two) times daily as needed. For vertigo      . metoprolol tartrate (LOPRESSOR) 25 MG tablet Take 25 mg by mouth 2 (two) times daily. 1/2 tablet bid      . nitroGLYCERIN (NITROSTAT) 0.4 MG SL tablet Place 0.4 mg under the tongue every 5 (five) minutes as needed. For chest pain      . ondansetron (ZOFRAN) 4 MG tablet Take 4 mg by mouth every 8 (eight) hours as needed for nausea.      . pantoprazole (PROTONIX) 40 MG tablet TAKE 1 TABLET (40 MG TOTAL) BY MOUTH DAILY.  30 tablet  5  . promethazine (PHENERGAN) 25 MG suppository Place 25 mg rectally every 6 (six) hours as needed for nausea.      . sertraline (ZOLOFT) 50 MG tablet Take 50 mg by mouth daily.         No current facility-administered medications for this visit.     Past Medical History  Diagnosis Date  . Atrial  flutter     recurrent asymptomatic  . CAD (coronary artery disease)     Lexiscan Myoview 11/13: no ischemia, septal and lateral defect (V-pacing vs tissue atten vs infarct), LVEF 58%  . History of stroke   . Aortic dissection     type B, contained  . HTN (hypertension)   . Ischemic heart disease   . MI (myocardial infarction)   . GERD (gastroesophageal reflux disease)   . Depression   . Stroke   . Hyperlipidemia   . Pacemaker   . Chronic diastolic CHF (congestive heart failure) 07/2012    Echo: LVEF 50-55%, grade 2 dd, mild MR/TR, mild LA dilatation, PASP 37 mmHg    ROS:   All systems reviewed and negative except as noted in the HPI.   Past Surgical History  Procedure Laterality Date  . Echcardiogram  2007, 2008  . Appendectomy    . Abdominal hysterectomy    . Joint replacement      complete RT knee     Family History  Problem Relation Age of Onset  . Colon cancer Father   . Stroke Father   . AAA (abdominal aortic aneurysm) Father   . Coronary artery disease Sister   . Heart disease Sister   . Cancer  Mother      History   Social History  . Marital Status: Widowed    Spouse Name: N/A    Number of Children: N/A  . Years of Education: N/A   Occupational History  . retired    Social History Main Topics  . Smoking status: Never Smoker   . Smokeless tobacco: Never Used  . Alcohol Use: No  . Drug Use: No  . Sexual Activity: Not Currently    Birth Control/ Protection: Post-menopausal   Other Topics Concern  . Not on file   Social History Narrative  . No narrative on file     BP 135/80  Pulse 79  Ht 5' 5.5" (1.664 m)  Wt 152 lb (68.947 kg)  BMI 24.90 kg/m2  Physical Exam:  Well but elderly appearing 80 year old woman,NAD HEENT: Unremarkable Neck:  No JVD, no thyromegally Back:  No CVA tenderness Lungs:  Clear with no wheezes or rhonchi. HEART:  Iregular rate rhythm, no murmurs, no rubs, no clicks Abd:  soft, positive bowel sounds, no organomegally, no rebound, no guarding Ext:  2 plus pulses, no edema, no cyanosis, no clubbing Skin:  No rashes no nodules Neuro:  CN II through XII intact, motor grossly intact  DEVICE  Normal device function.  See PaceArt for details.   Assess/Plan:

## 2014-02-28 ENCOUNTER — Encounter: Payer: Self-pay | Admitting: Internal Medicine

## 2014-05-29 ENCOUNTER — Encounter: Payer: Commercial Managed Care - HMO | Admitting: *Deleted

## 2014-05-30 ENCOUNTER — Telehealth: Payer: Self-pay | Admitting: Internal Medicine

## 2014-05-30 NOTE — Telephone Encounter (Signed)
New Message  Pt sisiter called . Reports she has not received the device to transmit signals. Requests a call back to discuss.

## 2014-05-30 NOTE — Telephone Encounter (Signed)
Spoke with pt sister and agreed to keep 12-7 appt to discuss remote monitoring further.

## 2014-08-21 IMAGING — CT CT CTA ABD/PEL W/CM AND/OR W/O CM
3 of 7 series · 11 of 36 positions shown, 17 images · IV contrast (omnipaque)
Comparison: 07/10/2010

CTA CHEST

CLINICAL DATA: Follow-up thoracoabdominal aneurysm

CT ANGIOGRAPHY CHEST, ABDOMEN AND PELVIS
TECHNIQUE: Multidetector CT imaging through the chest, abdomen and
pelvis was performed using the standard protocol during bolus
administration of intravenous contrast.  Multiplanar reconstructed
images including MIPs were obtained and reviewed to evaluate the
vascular anatomy.
Contrast: 80mL OMNIPAQUE IOHEXOL 350 MG/ML SOLN,

[Series 4: angio · axial · 0.86mm/px · z∈[-518,-12]mm · 8 of 260 slices shown, 13 images]
[im 29/260  soft-tissue]
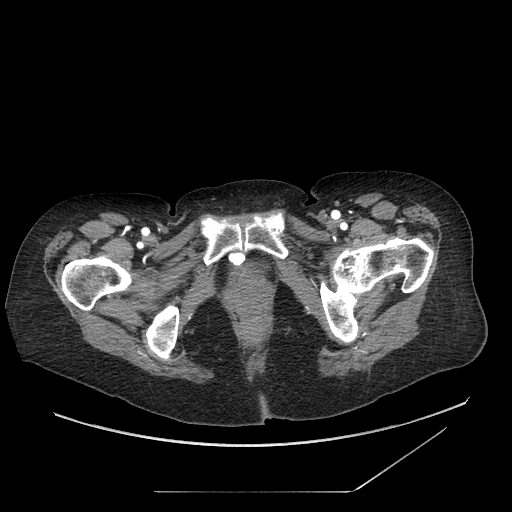
[im 29/260  bone]
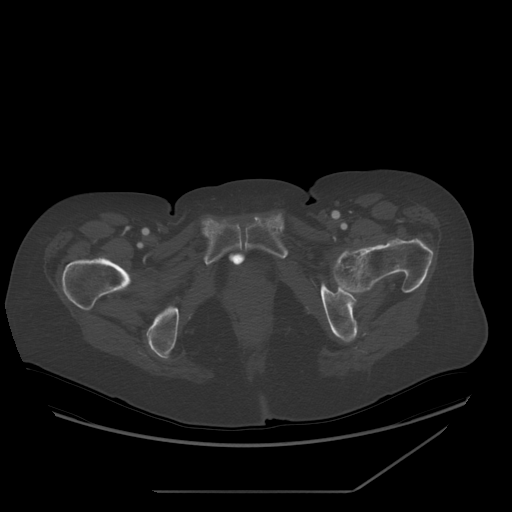
[im 58/260  soft-tissue]
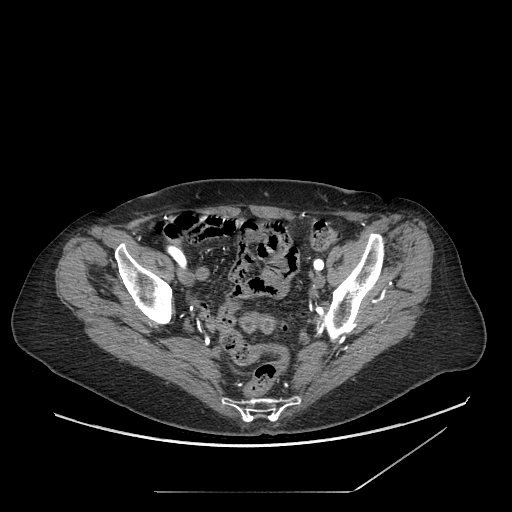
[im 87/260  soft-tissue]
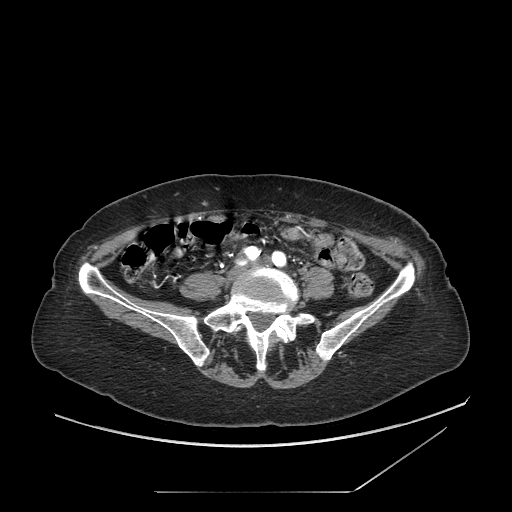
[im 116/260  soft-tissue]
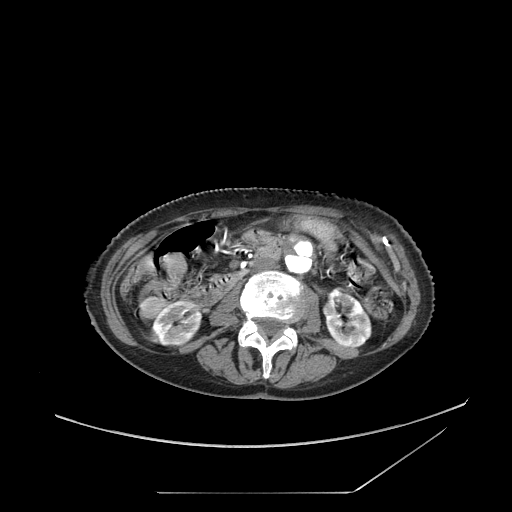
[im 144/260  soft-tissue]
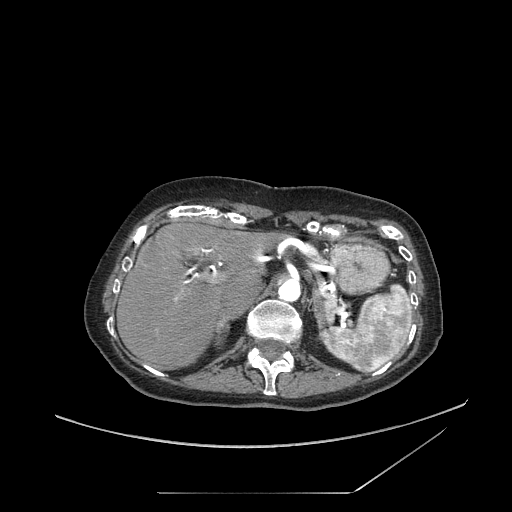
[im 144/260  lung]
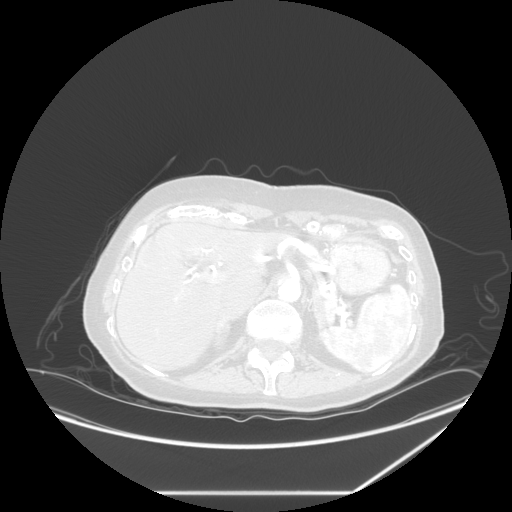
[im 173/260  soft-tissue]
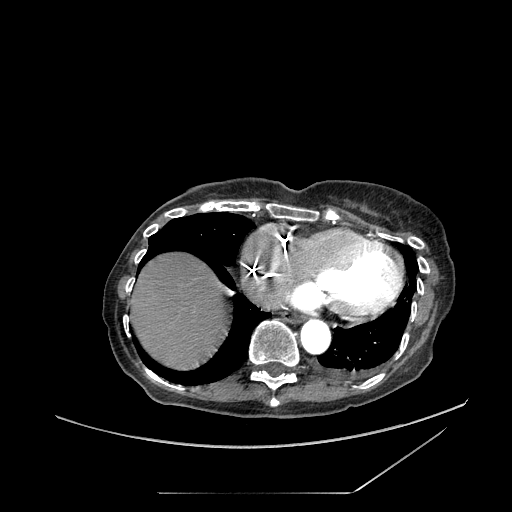
[im 173/260  lung]
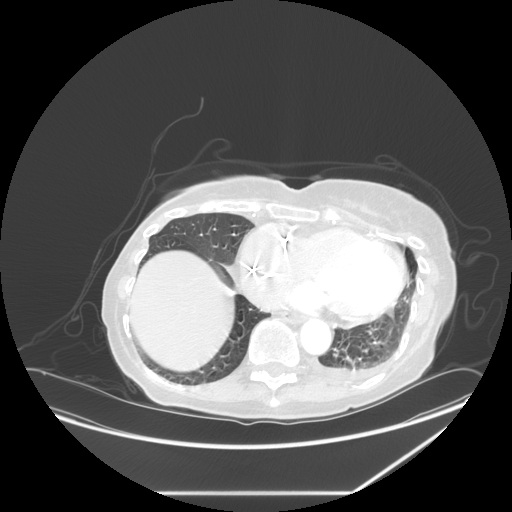
[im 202/260  soft-tissue]
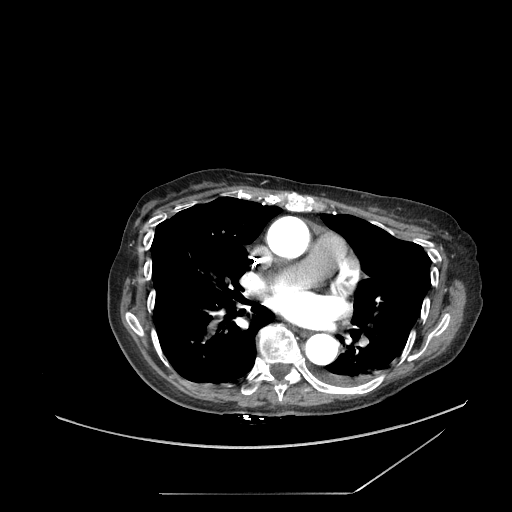
[im 202/260  lung]
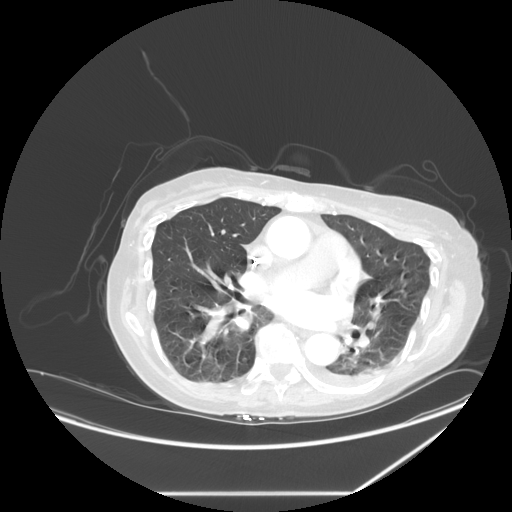
[im 231/260  soft-tissue]
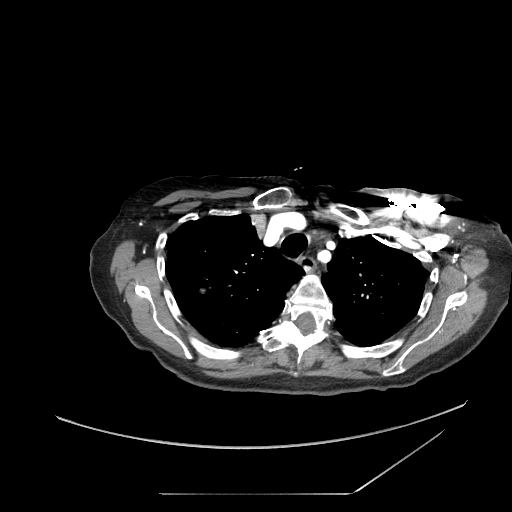
[im 231/260  lung]
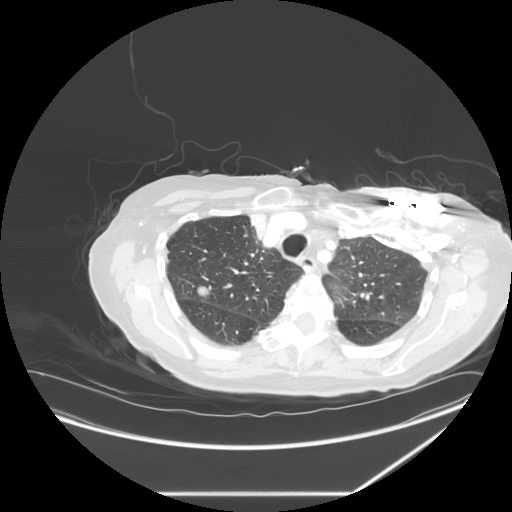

[Series 601: coronal body · coronal · 1.27mm/px · 1 of 130 slices shown, 2 images]
[im 44/130  soft-tissue]
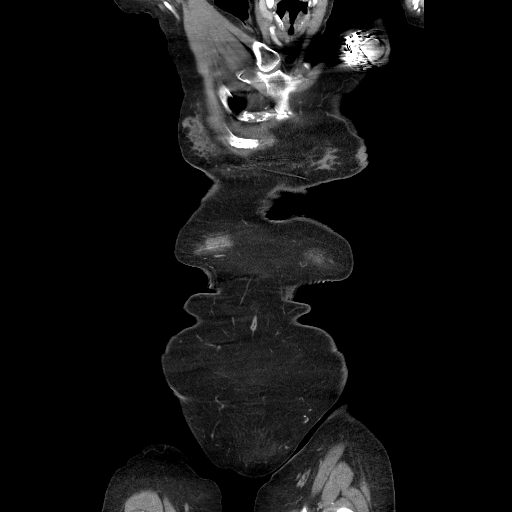
[im 44/130  bone]
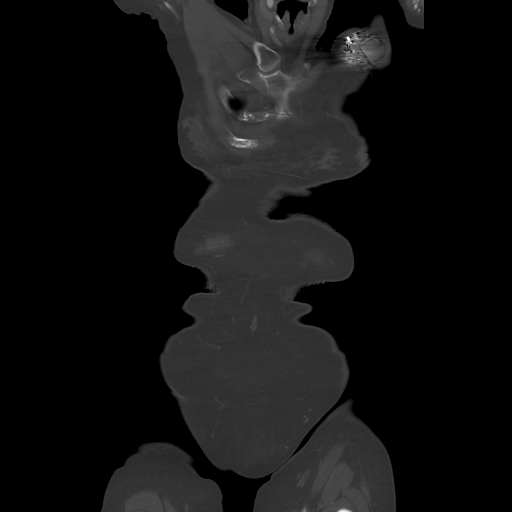

[Series 602: sagittal body · sagittal · 1.27mm/px · 2 of 176 slices shown]
[im 30/176  soft-tissue]
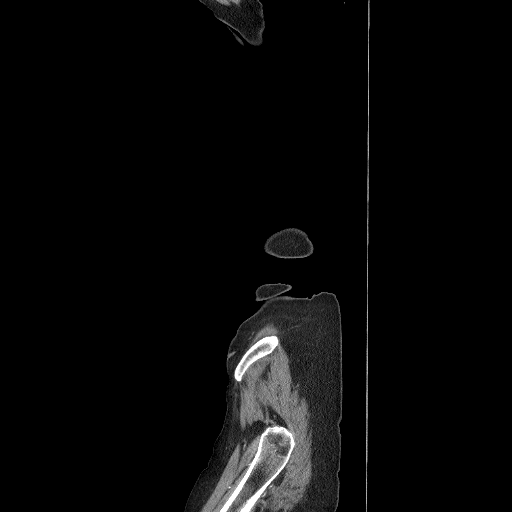
[im 59/176  soft-tissue]
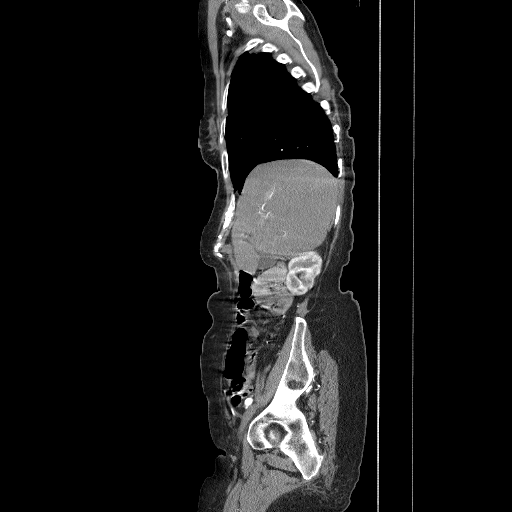

[11 of 36 positions shown; findings below may reference images not displayed]

FINDINGS: There is no evidence of aortic dissection involving the
ascending aorta, aortic arch, proximal descending aorta, and mid
descending aorta.  Dissection at the diaphragmatic hiatus will be
discussed in the abdomen CT.

Innominate artery, right subclavian artery, right common carotid
artery, bovine arch origin of the left common carotid artery, and
left subclavian artery are patent.  Left vertebral artery is
tortuous and patent.  Right vertebral artery is patent.

No obvious evidence of pulmonary thromboembolism.  There is
diminished opacification of the pulmonary arterial tree on this
arterial phase study.

Left subclavian pacemaker device is stable.

Mild pectus excavatum deformity unchanged.

Negative abnormal mediastinal adenopathy.  Thyroid gland is
heterogeneous.  No pericardial effusion.

Right upper lobe pulmonary nodule is stable.  On sagittal
reconstruction imaging, greatest diameter is 13 mm. This is stable
based on my direct measurements of the prior study.

Stable thoracic spine.

 Review of the MIP images confirms the above findings.
IMPRESSION: No evidence of aortic dissection involving the aortic arch,
proximal descending aorta, or mid descending aorta.  Dissection at
the diaphragmatic hiatus will be discussed in the abdominal
dictation.  Stable right upper lobe pulmonary nodule.

CTA ABDOMEN AND PELVIS
FINDINGS: The aortic dissection within the abdomen is again noted.
AP diameter of the aorta has slightly increased from 3.2 cm to
cm at table position -290.  The false lumen begins right
posterolaterally at the diaphragmatic hiatus than advances into the
anterior abdominal aorta.  It is partially thrombosed.  There is
still filling of contrast at the diaphragmatic hiatus as well as
the infrarenal aorta. The dissection does continue to extend into
the right common iliac and external iliac arteries.  There is
filling of both lumens in the right iliac vessels.

Celiac and SMA originate from the true lumen and are patent.  Renal
arteries originate from the true lumen and are patent.  There is a
tiny accessory left renal artery that is also patent from the true
lumen.  The IMA originates from the false lumen, is diminutive, and
is patent.

Right common iliac artery diameter is 13 mm which is not
significantly changed.

Kidneys are stable in appearance with bilateral sub centimeter
hypodensities.  Liver, gallbladder, spleen, pancreas, adrenal
glands are within normal limits.

No free fluid.  No abnormal adenopathy.  Are stable
calcification anterior to the base of the bladder.  This may
represent a small calculus and a bladder or urethral diverticulum.

No destructive bone lesion.

 Review of the MIP images confirms the above findings.
IMPRESSION: Aortic dissection beginning at the diaphragmatic hiatus and
extending into the right common and external iliac arteries is
again noted.  It is largely stable.  AP diameter of the aorta has
increased from 3.2 centimeters to 3.6 cm.  Visceral vessels are
patent.

## 2014-08-27 ENCOUNTER — Ambulatory Visit (INDEPENDENT_AMBULATORY_CARE_PROVIDER_SITE_OTHER): Payer: Medicare FFS | Admitting: *Deleted

## 2014-08-27 DIAGNOSIS — I4891 Unspecified atrial fibrillation: Secondary | ICD-10-CM

## 2014-08-27 LAB — MDC_IDC_ENUM_SESS_TYPE_INCLINIC
Brady Statistic RV Percent Paced: 99 %
Implantable Pulse Generator Model: 2210
Lead Channel Impedance Value: 560 Ohm
Lead Channel Pacing Threshold Pulse Width: 0.4 ms
Lead Channel Setting Pacing Amplitude: 2.5 V
Lead Channel Setting Pacing Pulse Width: 0.5 ms
Lead Channel Setting Sensing Sensitivity: 2.5 mV
MDC IDC MSMT BATTERY REMAINING PERCENTAGE: 91 %
MDC IDC MSMT BATTERY VOLTAGE: 2.95 V
MDC IDC MSMT LEADCHNL RV PACING THRESHOLD AMPLITUDE: 0.75 V
MDC IDC PG SERIAL: 7227567

## 2014-08-27 NOTE — Progress Notes (Signed)
PPM check in office. 

## 2014-09-07 ENCOUNTER — Encounter: Payer: Self-pay | Admitting: Internal Medicine

## 2014-12-03 ENCOUNTER — Encounter: Payer: Medicare FFS | Admitting: *Deleted

## 2014-12-03 ENCOUNTER — Telehealth: Payer: Self-pay | Admitting: Internal Medicine

## 2014-12-03 NOTE — Telephone Encounter (Signed)
Informed pt sister that I would send her a cellular adapter as soon as I received stock in the office. Device tech informed pt sister that if the nursing home is going to transition pt care that the nursing home needs to call our office and verbalize that with us.

## 2014-12-03 NOTE — Telephone Encounter (Signed)
New message      Pt is in a nursing home.  They never got the st jude monitor to transmit with.  Please call

## 2014-12-06 ENCOUNTER — Encounter: Payer: Self-pay | Admitting: Cardiology

## 2015-01-17 ENCOUNTER — Telehealth: Payer: Self-pay | Admitting: Cardiology

## 2015-01-17 NOTE — Telephone Encounter (Signed)
Pt lives in a nursing home in [place]. Pt attempted sending transmission w/ cell adapter but was unsuccessful due to lack of cell signal. Pt can not hook up to land line phone. Pt sister is going to try and take pt to her home and send signal from her home. Pt sister informed that if it is unsuccessful to speak w/ MD about having staff at nursing home interrogate device and faxing results to our office. Pt sister verbalized understanding and said she would try send a transmission one day next week from her home.

## 2015-02-26 ENCOUNTER — Telehealth: Payer: Self-pay | Admitting: *Deleted

## 2015-02-26 ENCOUNTER — Encounter: Payer: Self-pay | Admitting: *Deleted

## 2015-02-26 NOTE — Telephone Encounter (Signed)
spoke top [name] & found out that [name] is in the nursing home, will speak to pt at appt time.Marland Kitchen..Marland Kitchen

## 2015-02-28 ENCOUNTER — Ambulatory Visit (INDEPENDENT_AMBULATORY_CARE_PROVIDER_SITE_OTHER): Payer: Medicare FFS | Admitting: Internal Medicine

## 2015-02-28 ENCOUNTER — Encounter: Payer: Self-pay | Admitting: Internal Medicine

## 2015-02-28 VITALS — BP 114/64 | HR 79 | Ht 65.6 in | Wt 153.2 lb

## 2015-02-28 DIAGNOSIS — I4891 Unspecified atrial fibrillation: Secondary | ICD-10-CM

## 2015-02-28 DIAGNOSIS — Z950 Presence of cardiac pacemaker: Secondary | ICD-10-CM | POA: Diagnosis not present

## 2015-02-28 DIAGNOSIS — I5032 Chronic diastolic (congestive) heart failure: Secondary | ICD-10-CM | POA: Diagnosis not present

## 2015-02-28 LAB — CUP PACEART INCLINIC DEVICE CHECK
Battery Remaining Longevity: 123.6 mo
Battery Voltage: 2.95 V
Brady Statistic RA Percent Paced: 0 %
Brady Statistic RV Percent Paced: 99.14 %
Date Time Interrogation Session: 20160609133956
Lead Channel Pacing Threshold Amplitude: 0.625 V
Lead Channel Pacing Threshold Pulse Width: 0.5 ms
Lead Channel Sensing Intrinsic Amplitude: 12 mV
Lead Channel Setting Pacing Amplitude: 0.875
Lead Channel Setting Pacing Pulse Width: 0.5 ms
Lead Channel Setting Sensing Sensitivity: 2.5 mV
Pulse Gen Model: 2210
Pulse Gen Serial Number: 7227567

## 2015-02-28 NOTE — Patient Instructions (Signed)
Medication Instructions:  Your physician recommends that you continue on your current medications as directed. Please refer to the Current Medication list given to you today.     Labwork: None ordered  Testing/Procedures: None ordered  Follow-Up: Your physician wants you to follow-up in: 12 months with Dr Taylor You will receive a reminder letter in the mail two months in advance. If you don't receive a letter, please call our office to schedule the follow-up appointment.   Remote monitoring is used to monitor your Pacemaker or ICD from home. This monitoring reduces the number of office visits required to check your device to one time per year. It allows us to keep an eye on the functioning of your device to ensure it is working properly. You are scheduled for a device check from home on 05/30/15. You may send your transmission at any time that day. If you have a wireless device, the transmission will be sent automatically. After your physician reviews your transmission, you will receive a postcard with your next transmission date. '  Any Other Special Instructions Will Be Listed Below (If Applicable).   

## 2015-02-28 NOTE — Assessment & Plan Note (Signed)
Her symptoms remain class 2. Her dietary choices are somewhat limited by her living in assisted living.  Will follow. No change medical therapy at this time.

## 2015-02-28 NOTE — Assessment & Plan Note (Signed)
Her ventricular rate is now well controlled and she is pacing almost 100% of the time. She is not a candidate for systemic anti-coagulation due to a h/o bleeding and propensity to fall.

## 2015-02-28 NOTE — Assessment & Plan Note (Signed)
Her St. Jude PPM is working normally. She is pacing in the ventricle 98% of the time.

## 2015-02-28 NOTE — Progress Notes (Signed)
HPI Mrs. Edstrom returns today for followup.she is a very pleasant 81 year old woman with recurrent stroke, vascular heart disease, and hypertension. She has persistent atrial fibrillation.  In the interim, she continues to live in an assisted living facility and is visibly improved. The patient no longer is bothered by headaches and has gained weight. She is more stable on her feet. She has developed worsening dementia. Allergies  Allergen Reactions  . Amiodarone   . Amoxicillin     Unknown reaction  . Ampicillin   . Chocolate Nausea And Vomiting and Other (See Comments)    headache  . Dronedarone   . Latex Other (See Comments)    Blistering and skin loss     Current Outpatient Prescriptions  Medication Sig Dispense Refill  . acetaminophen (TYLENOL) 325 MG tablet Take 650 mg by mouth every 4 (four) hours as needed. Take 2 tablets every 4 hours as needed for pain    . brimonidine (ALPHAGAN) 0.2 % ophthalmic solution 1 drop 2 (two) times daily.    . carvedilol (COREG) 3.125 MG tablet Take 3.125 mg by mouth 2 (two) times daily with a meal.     . cetirizine (ZYRTEC) 10 MG tablet Take 10 mg by mouth daily.      Marland Kitchen donepezil (ARICEPT) 5 MG tablet Take 10 mg by mouth daily.     . ferrous gluconate (FERGON) 325 MG tablet Take 325 mg by mouth daily with breakfast.    . furosemide (LASIX) 20 MG tablet Take 20 mg by mouth daily as needed.     Marland Kitchen KLOR-CON M20 20 MEQ tablet Take 1 tablet by mouth daily as needed. Only takes when taking fluid pill    . LATANOPROST OP Apply 1 drop to eye daily.    Marland Kitchen levothyroxine (SYNTHROID, LEVOTHROID) 75 MCG tablet Take 75 mcg by mouth daily.    Marland Kitchen lisinopril (PRINIVIL,ZESTRIL) 5 MG tablet Take 5 mg by mouth daily.     . meclizine (ANTIVERT) 25 MG tablet Take 25 mg by mouth 2 (two) times daily as needed. For vertigo    . nitroGLYCERIN (NITROSTAT) 0.4 MG SL tablet Place 0.4 mg under the tongue every 5 (five) minutes as needed. For chest pain    . omeprazole (PRILOSEC)  20 MG capsule Take 20 mg by mouth daily.     . ondansetron (ZOFRAN) 4 MG tablet Take 4 mg by mouth every 8 (eight) hours as needed for nausea.    . sertraline (ZOLOFT) 50 MG tablet Take 50 mg by mouth daily.       No current facility-administered medications for this visit.     Past Medical History  Diagnosis Date  . Atrial flutter     recurrent asymptomatic  . CAD (coronary artery disease)     Lexiscan Myoview 11/13: no ischemia, septal and lateral defect (V-pacing vs tissue atten vs infarct), LVEF 58%  . History of stroke   . Aortic dissection     type B, contained  . HTN (hypertension)   . Ischemic heart disease   . MI (myocardial infarction)   . GERD (gastroesophageal reflux disease)   . Depression   . Stroke   . Hyperlipidemia   . Pacemaker   . Chronic diastolic CHF (congestive heart failure) 07/2012    Echo: LVEF 50-55%, grade 2 dd, mild MR/TR, mild LA dilatation, PASP 37 mmHg    ROS:   All systems reviewed and negative except as noted in the HPI.   Past Surgical History  Procedure Laterality Date  . Echcardiogram  2007, 2008  . Appendectomy    . Abdominal hysterectomy    . Joint replacement      complete RT knee     Family History  Problem Relation Age of Onset  . Colon cancer Father   . Stroke Father   . AAA (abdominal aortic aneurysm) Father   . Coronary artery disease Sister   . Heart disease Sister   . Cancer Mother      History   Social History  . Marital Status: Widowed    Spouse Name: N/A  . Number of Children: N/A  . Years of Education: N/A   Occupational History  . retired    Social History Main Topics  . Smoking status: Never Smoker   . Smokeless tobacco: Never Used  . Alcohol Use: No  . Drug Use: No  . Sexual Activity: Not Currently    Birth Control/ Protection: Post-menopausal   Other Topics Concern  . Not on file   Social History Narrative     BP 114/64 mmHg  Pulse 79  Ht 5' 5.6" (1.666 m)  Wt 153 lb 3.2 oz  (69.491 kg)  BMI 25.04 kg/m2  SpO2 99%  Physical Exam:  Frail and elderly appearing 81 year old woman, NAD HEENT: Unremarkable Neck:  6 cm JVD, no thyromegally Back:  No CVA tenderness Lungs:  Clear with no wheezes or rhonchi. HEART:  Iregular rate rhythm, no murmurs, no rubs, no clicks Abd:  soft, positive bowel sounds, no organomegally, no rebound, no guarding Ext:  2 plus pulses, no edema, no cyanosis, no clubbing Skin:  No rashes no nodules Neuro:  CN II through XII intact, motor grossly intact  DEVICE  Normal device function.  See PaceArt for details.   Assess/Plan:

## 2015-05-30 ENCOUNTER — Encounter: Payer: Medicare FFS | Admitting: *Deleted

## 2015-05-30 ENCOUNTER — Telehealth: Payer: Self-pay | Admitting: Cardiology

## 2015-05-30 NOTE — Telephone Encounter (Signed)
Confirmed remote transmission w/ pt sister and she stated that they can not get monitor to work at the nursing home pt is at. Pt sister agreed to appt on 12- 12-16 at 83 Noon.

## 2015-05-31 ENCOUNTER — Encounter: Payer: Self-pay | Admitting: Cardiology

## 2015-06-25 ENCOUNTER — Encounter: Payer: Self-pay | Admitting: Family

## 2015-06-27 ENCOUNTER — Ambulatory Visit: Payer: Medicare FFS | Admitting: Family

## 2015-06-28 ENCOUNTER — Telehealth: Payer: Self-pay | Admitting: Vascular Surgery

## 2015-06-28 NOTE — Telephone Encounter (Addendum)
Lm for daughter to inform her that we have canceled all of the pt's appts.  ----- Message from Sherren Kerns, MD sent at 06/28/2015 10:57 AM EDT ----- Regarding: RE: cancel all appts? Cancel ----- Message -----    From: Jena Gauss    Sent: 06/26/2015   2:26 PM      To: Sherren Kerns, MD Subject: cancel all appts?                              Hi Dr. Darrick Penna,  This pt's daughter called to ask if is necessary to keep the cta and f/u appts. The pt is very weak and does not travel well. The daughter says that since "you can not do anything" she does not want to have to put her mother through all of the traveling but wanted your input.  Thank you, Elon Jester

## 2015-07-11 ENCOUNTER — Inpatient Hospital Stay: Admission: RE | Admit: 2015-07-11 | Payer: Medicare FFS | Source: Ambulatory Visit

## 2015-07-11 ENCOUNTER — Other Ambulatory Visit: Payer: Medicare FFS

## 2015-07-15 ENCOUNTER — Encounter: Payer: Self-pay | Admitting: Internal Medicine

## 2015-07-18 ENCOUNTER — Ambulatory Visit: Payer: Medicare FFS | Admitting: Vascular Surgery

## 2015-08-23 ENCOUNTER — Telehealth: Payer: Self-pay | Admitting: Internal Medicine

## 2015-08-23 NOTE — Telephone Encounter (Signed)
New Message  Pt sister calling to see if remote transmission from Oct/2016 was sent successfully. Please call back and discuss.

## 2015-08-23 NOTE — Telephone Encounter (Signed)
Spoke w/ pt sister and informed her that we have not received any device interrogations from the facility where the pt resides. Pt sister is going to have nursing home fax another copy to the device clinic fax machine,

## 2016-01-24 ENCOUNTER — Telehealth: Payer: Self-pay | Admitting: Cardiology

## 2016-01-24 NOTE — Telephone Encounter (Signed)
Pt sister called in and wanted to know if pt had to come to office for yearly OV w/ MD in 02/2016? Pt sister stated that pt has device interrogated at the facility and that they are suppose to fax the reports to us. I informed her that the last report I had was from 06-2015. She stated that pt [place] over [place] miles away and it is very difficult for pt to travel that much in one day. Informed pt sister that I would send MD a message asking if the facility monitoring pt device would be sufficient for pt device interrogation and that pt MD is not in the office today and that it would be one day next week before I could call her back with an answer. Pt sister verbalized understanding.

## 2016-01-28 NOTE — Telephone Encounter (Signed)
Left message for sister that she would need to be seen yearly in order to fill her medications.  If she needs to find a MD closer to home she may do so if this will help

## 2016-01-29 ENCOUNTER — Telehealth: Payer: Self-pay | Admitting: Internal Medicine

## 2016-01-29 NOTE — Telephone Encounter (Signed)
F/u  Pt returning RN phone call- only wanted to speak w/ RN. Please call back and discuss.

## 2016-01-29 NOTE — Telephone Encounter (Signed)
Returned call to patient's sister and she says Dr Oran ReinFant is seeing in patient and following her device. She is going to take the box back to the facility to try and hook up her monitor again.  She is going to call the 1-800 number from facility to see if they can help her.   I have made an appointment for her sister to follow up with Dr Ladona Ridgelaylor in July.  She is aware of date and time

## 2016-04-17 ENCOUNTER — Encounter: Payer: Self-pay | Admitting: Internal Medicine

## 2016-04-17 ENCOUNTER — Ambulatory Visit (INDEPENDENT_AMBULATORY_CARE_PROVIDER_SITE_OTHER): Payer: Medicare FFS | Admitting: Internal Medicine

## 2016-04-17 VITALS — BP 110/62 | HR 75 | Ht 65.0 in | Wt 132.6 lb

## 2016-04-17 DIAGNOSIS — Z950 Presence of cardiac pacemaker: Secondary | ICD-10-CM

## 2016-04-17 DIAGNOSIS — I4891 Unspecified atrial fibrillation: Secondary | ICD-10-CM | POA: Diagnosis not present

## 2016-04-17 LAB — CUP PACEART INCLINIC DEVICE CHECK
Implantable Lead Implant Date: 20120427
Implantable Lead Location: 753859
Lead Channel Pacing Threshold Amplitude: 0.75 V
Lead Channel Pacing Threshold Amplitude: 0.75 V
Lead Channel Pacing Threshold Pulse Width: 0.5 ms
Lead Channel Sensing Intrinsic Amplitude: 12 mV
MDC IDC LEAD IMPLANT DT: 20120427
MDC IDC LEAD LOCATION: 753860
MDC IDC LEAD MODEL: 1948
MDC IDC MSMT BATTERY REMAINING LONGEVITY: 120 mo
MDC IDC MSMT BATTERY VOLTAGE: 2.95 V
MDC IDC MSMT LEADCHNL RA SENSING INTR AMPL: 4.1 mV
MDC IDC MSMT LEADCHNL RV IMPEDANCE VALUE: 550 Ohm
MDC IDC MSMT LEADCHNL RV PACING THRESHOLD PULSEWIDTH: 0.5 ms
MDC IDC SESS DTM: 20170728162341
MDC IDC SET LEADCHNL RV PACING AMPLITUDE: 1 V
MDC IDC SET LEADCHNL RV PACING PULSEWIDTH: 0.5 ms
MDC IDC SET LEADCHNL RV SENSING SENSITIVITY: 2.5 mV
MDC IDC STAT BRADY RA PERCENT PACED: 0 %
MDC IDC STAT BRADY RV PERCENT PACED: 96 %
Pulse Gen Model: 2210
Pulse Gen Serial Number: 7227567

## 2016-04-17 NOTE — Progress Notes (Signed)
HPI [name] returns today for followup.she is a very pleasant 82 year old woman with recurrent stroke, vascular heart disease, and hypertension. She has persistent atrial fibrillation.  In the interim, she continues to live in an assisted living facility. She has fallen.  She has developed worsening dementia. She does not know who I am. Allergies  Allergen Reactions  . Amiodarone     unknown  . Amoxicillin     Unknown reaction  . Ampicillin     unknown  . Chocolate Nausea And Vomiting and Other (See Comments)    headache  . Dronedarone     unknown  . Latex Other (See Comments)    Blistering and skin loss  . Penicillins     Unknown      Current Outpatient Prescriptions  Medication Sig Dispense Refill  . acetaminophen (TYLENOL) 325 MG tablet Take 650 mg by mouth every 8 (eight) hours as needed (pain). Take 2 tablets every 4 hours as needed for pain     . brimonidine (ALPHAGAN) 0.2 % ophthalmic solution Place 1 drop into both eyes 2 (two) times daily. Use as directed    . carvedilol (COREG) 6.25 MG tablet Take 6.25 mg by mouth daily.    . clopidogrel (PLAVIX) 75 MG tablet Take 75 mg by mouth daily.    Marland Kitchen donepezil (ARICEPT) 10 MG tablet Take 10 mg by mouth at bedtime.    . ferrous gluconate (FERGON) 325 MG tablet Take 325 mg by mouth daily with breakfast.    . KLOR-CON M20 20 MEQ tablet Take 1 tablet by mouth daily.     Marland Kitchen LATANOPROST OP Apply 1 drop to eye daily.    Marland Kitchen levothyroxine (SYNTHROID, LEVOTHROID) 75 MCG tablet Take 75 mcg by mouth daily.    . nitroGLYCERIN (NITROSTAT) 0.4 MG SL tablet Place 0.4 mg under the tongue every 5 (five) minutes x 3 doses as needed. For chest pain    . pantoprazole (PROTONIX) 40 MG tablet Take 40 mg by mouth daily.    . sertraline (ZOLOFT) 25 MG tablet Take 25 mg by mouth at bedtime.    . sertraline (ZOLOFT) 50 MG tablet Take 50 mg by mouth at bedtime. Take along with 25 mg tablet to equal 75 mg at night     No current facility-administered  medications for this visit.      Past Medical History:  Diagnosis Date  . Aortic dissection (HCC)    type B, contained  . Atrial flutter (HCC)    recurrent asymptomatic  . CAD (coronary artery disease)    Lexiscan Myoview 11/13: no ischemia, septal and lateral defect (V-pacing vs tissue atten vs infarct), LVEF 58%  . Chronic diastolic CHF (congestive heart failure) (HCC) 07/2012   Echo: LVEF 50-55%, grade 2 dd, mild MR/TR, mild LA dilatation, PASP 37 mmHg  . Depression   . GERD (gastroesophageal reflux disease)   . History of stroke   . HTN (hypertension)   . Hyperlipidemia   . Ischemic heart disease   . MI (myocardial infarction) (HCC)   . Pacemaker   . Stroke (HCC)     ROS:   All systems reviewed and negative except as noted in the HPI.   Past Surgical History:  Procedure Laterality Date  . ABDOMINAL HYSTERECTOMY    . APPENDECTOMY    . echcardiogram  2007, 2008  . JOINT REPLACEMENT     complete RT knee     Family History  Problem Relation Age of Onset  .  Colon cancer Father   . Stroke Father   . AAA (abdominal aortic aneurysm) Father   . Coronary artery disease Sister   . Heart disease Sister   . Cancer Mother      Social History   Social History  . Marital status: Widowed    Spouse name: N/A  . Number of children: N/A  . Years of education: N/A   Occupational History  . retired    Social History Main Topics  . Smoking status: Never Smoker  . Smokeless tobacco: Never Used  . Alcohol use No  . Drug use: No  . Sexual activity: Not Currently    Birth control/ protection: Post-menopausal   Other Topics Concern  . Not on file   Social History Narrative  . No narrative on file     BP 110/62   Pulse 75   Ht 5\' 5"  (1.651 m)   Wt 132 lb 9.6 oz (60.1 kg)   BMI 22.07 kg/m   Physical Exam:  Frail and elderly appearing 82 year old woman, NAD HEENT: Unremarkable Neck:  6 cm JVD, no thyromegally Back:  No CVA tenderness Lungs:  Clear with  no wheezes or rhonchi. HEART:  Iregular rate rhythm, no murmurs, no rubs, no clicks Abd:  soft, positive bowel sounds, no organomegally, no rebound, no guarding Ext:  2 plus pulses, trace peripheral edema, no cyanosis, no clubbing Skin:  No rashes no nodules Neuro:  CN II through XII intact, motor grossly intact  DEVICE  Normal device function.  See PaceArt for details.   Assess/Plan: 1. Atrial fib - her ventricular rate is well controlled. Will follow. 2. PPM - her [employer] VVI PM is working normally. Will follow. 3. HTN - her blood pressure is controlled. She will continue her current meds. 4. Dementia - she is not agitated. Her mood is good. Her dementia is fairly severe however.  Leonia Reeves.D.

## 2016-04-17 NOTE — Patient Instructions (Signed)

## 2016-04-21 ENCOUNTER — Encounter: Payer: Self-pay | Admitting: Internal Medicine

## 2017-09-07 ENCOUNTER — Encounter: Payer: Medicare FFS | Admitting: Internal Medicine

## 2019-02-02 ENCOUNTER — Telehealth: Payer: Self-pay | Admitting: Cardiology

## 2019-02-02 NOTE — Telephone Encounter (Signed)
Spoke w/ pt sister and she informed me that pt is in a nursing home and is unable to make the trip to [facility] any more. She stated that the facility the patient is in is checking patient PPM and she is being seen by the MD at the facility.

## 2019-02-02 NOTE — Telephone Encounter (Signed)
Pt is overdue with Dr. Ladona Ridgel as of 03/2017 per recall. Per previous notes, pt is unable to use Merlin monitor at facility due to remote location/poor signal.  Routed to Dr. Ladona Ridgel as [name].

## 2019-02-15 NOTE — Telephone Encounter (Signed)
If unable to transmit or come to [facility], we will not be able to check her. She has been in declining health for many years.

## 2019-02-16 NOTE — Telephone Encounter (Signed)
Spoke with Tyler Aas, patient's sister. She confirmed that pt resides at [facility] in [place], [place] [phone]). Spoke with patient's nurse, Eunice Blase. She confirmed that pt's device is being checked "by phone" at facility, industry checked PPM most recently ~6 months ago. Dr. Michaell Cowing follows device.  Unenrolled from [employer], marked inactive in [place].

## 2019-05-30 ENCOUNTER — Telehealth: Payer: Self-pay | Admitting: Internal Medicine

## 2019-05-30 NOTE — Telephone Encounter (Signed)
Returned call to Pt's EC.  Advised our office is no longer follow Pt device.  Deferred to Dr. Henderson Cloud for further follow up.

## 2019-05-30 NOTE — Telephone Encounter (Signed)
New Message    Patient has pace make since April 2012 and hasn't had battery changed yet.  Wants to know when do they have to get the battery replaced.  Please call patient back to advise.

## 2022-01-14 ENCOUNTER — Telehealth: Payer: Self-pay | Admitting: Genetic Counselor

## 2022-01-14 NOTE — Telephone Encounter (Signed)
Received call from Ms. Calo about genetic testing after her sister, Lupita Leash, had genetic testing.  Discussed that Ms. Erker is also a candidate for genetic testing based on their deceased sister's history of pancreatic cancer.  Reviewed benefits and limitations of genetic testing. Provided phone number to [facility] genetic counselors given that she prefers to be seen in [facility].  ?
